# Patient Record
Sex: Female | Born: 1945 | Race: White | Hispanic: No | Smoking: Never smoker
Health system: Southern US, Community
[De-identification: ages and names within clinical notes are randomized; demographics above are authoritative.]

## PROBLEM LIST (undated history)

## (undated) DIAGNOSIS — I493 Ventricular premature depolarization: Secondary | ICD-10-CM

## (undated) DIAGNOSIS — B2790 Infectious mononucleosis, unspecified without complication: Secondary | ICD-10-CM

## (undated) DIAGNOSIS — E785 Hyperlipidemia, unspecified: Secondary | ICD-10-CM

## (undated) DIAGNOSIS — I341 Nonrheumatic mitral (valve) prolapse: Secondary | ICD-10-CM

## (undated) DIAGNOSIS — Z0000 Encounter for general adult medical examination without abnormal findings: Secondary | ICD-10-CM

## (undated) DIAGNOSIS — Z8669 Personal history of other diseases of the nervous system and sense organs: Secondary | ICD-10-CM

## (undated) DIAGNOSIS — IMO0002 Reserved for concepts with insufficient information to code with codable children: Secondary | ICD-10-CM

## (undated) DIAGNOSIS — K219 Gastro-esophageal reflux disease without esophagitis: Secondary | ICD-10-CM

## (undated) DIAGNOSIS — N6019 Diffuse cystic mastopathy of unspecified breast: Secondary | ICD-10-CM

## (undated) DIAGNOSIS — K589 Irritable bowel syndrome without diarrhea: Secondary | ICD-10-CM

## (undated) DIAGNOSIS — Z9889 Other specified postprocedural states: Secondary | ICD-10-CM

## (undated) HISTORY — PX: BREAST CYST ASPIRATION: SHX578

## (undated) HISTORY — DX: Ventricular premature depolarization: I49.3

## (undated) HISTORY — PX: BIOPSY THYROID: PRO38

## (undated) HISTORY — DX: Hyperlipidemia, unspecified: E78.5

## (undated) HISTORY — DX: Personal history of other diseases of the nervous system and sense organs: Z86.69

## (undated) HISTORY — DX: Nonrheumatic mitral (valve) prolapse: I34.1

## (undated) HISTORY — DX: Diffuse cystic mastopathy of unspecified breast: N60.19

## (undated) HISTORY — DX: Infectious mononucleosis, unspecified without complication: B27.90

## (undated) HISTORY — DX: Irritable bowel syndrome, unspecified: K58.9

## (undated) HISTORY — DX: Other specified postprocedural states: Z98.890

## (undated) HISTORY — PX: LAPAROSCOPIC TUBAL LIGATION: SHX1937

## (undated) HISTORY — DX: Reserved for concepts with insufficient information to code with codable children: IMO0002

## (undated) HISTORY — DX: Encounter for general adult medical examination without abnormal findings: Z00.00

---

## 1990-09-10 HISTORY — PX: VAGINAL HYSTERECTOMY: SUR661

## 1996-12-31 ENCOUNTER — Encounter: Payer: Self-pay | Admitting: Internal Medicine

## 1999-09-13 ENCOUNTER — Other Ambulatory Visit: Admission: RE | Admit: 1999-09-13 | Discharge: 1999-09-13 | Payer: Self-pay | Admitting: Obstetrics and Gynecology

## 1999-10-12 ENCOUNTER — Encounter: Payer: Self-pay | Admitting: Internal Medicine

## 1999-10-12 LAB — CONVERTED CEMR LAB

## 2000-11-05 ENCOUNTER — Other Ambulatory Visit: Admission: RE | Admit: 2000-11-05 | Discharge: 2000-11-05 | Payer: Self-pay | Admitting: Obstetrics and Gynecology

## 2001-11-25 ENCOUNTER — Other Ambulatory Visit: Admission: RE | Admit: 2001-11-25 | Discharge: 2001-11-25 | Payer: Self-pay | Admitting: Gynecology

## 2003-06-28 ENCOUNTER — Other Ambulatory Visit: Admission: RE | Admit: 2003-06-28 | Discharge: 2003-06-28 | Payer: Self-pay | Admitting: Gynecology

## 2003-06-30 ENCOUNTER — Encounter: Payer: Self-pay | Admitting: Gynecology

## 2003-06-30 ENCOUNTER — Encounter: Admission: RE | Admit: 2003-06-30 | Discharge: 2003-06-30 | Payer: Self-pay | Admitting: Gynecology

## 2004-10-20 ENCOUNTER — Encounter: Admission: RE | Admit: 2004-10-20 | Discharge: 2004-10-20 | Payer: Self-pay | Admitting: Gynecology

## 2004-10-24 ENCOUNTER — Other Ambulatory Visit: Admission: RE | Admit: 2004-10-24 | Discharge: 2004-10-24 | Payer: Self-pay | Admitting: Gynecology

## 2004-10-24 ENCOUNTER — Encounter: Payer: Self-pay | Admitting: Internal Medicine

## 2004-10-24 LAB — CONVERTED CEMR LAB

## 2005-01-05 ENCOUNTER — Ambulatory Visit: Payer: Self-pay | Admitting: Internal Medicine

## 2005-01-31 ENCOUNTER — Ambulatory Visit: Payer: Self-pay | Admitting: Internal Medicine

## 2005-03-06 ENCOUNTER — Other Ambulatory Visit: Admission: RE | Admit: 2005-03-06 | Discharge: 2005-03-06 | Payer: Self-pay | Admitting: Gynecology

## 2005-10-31 ENCOUNTER — Other Ambulatory Visit: Admission: RE | Admit: 2005-10-31 | Discharge: 2005-10-31 | Payer: Self-pay | Admitting: Gynecology

## 2005-11-02 ENCOUNTER — Encounter: Admission: RE | Admit: 2005-11-02 | Discharge: 2005-11-02 | Payer: Self-pay | Admitting: Gynecology

## 2005-11-09 ENCOUNTER — Ambulatory Visit: Payer: Self-pay | Admitting: Internal Medicine

## 2005-11-16 ENCOUNTER — Ambulatory Visit: Payer: Self-pay | Admitting: Internal Medicine

## 2005-11-26 ENCOUNTER — Ambulatory Visit (HOSPITAL_COMMUNITY): Admission: RE | Admit: 2005-11-26 | Discharge: 2005-11-26 | Payer: Self-pay | Admitting: Internal Medicine

## 2005-11-29 ENCOUNTER — Ambulatory Visit: Payer: Self-pay

## 2005-11-29 ENCOUNTER — Encounter: Payer: Self-pay | Admitting: Cardiology

## 2005-12-07 ENCOUNTER — Encounter (INDEPENDENT_AMBULATORY_CARE_PROVIDER_SITE_OTHER): Payer: Self-pay | Admitting: Specialist

## 2005-12-07 ENCOUNTER — Ambulatory Visit (HOSPITAL_COMMUNITY): Admission: RE | Admit: 2005-12-07 | Discharge: 2005-12-07 | Payer: Self-pay | Admitting: Internal Medicine

## 2005-12-21 ENCOUNTER — Ambulatory Visit: Payer: Self-pay | Admitting: Internal Medicine

## 2006-09-17 ENCOUNTER — Other Ambulatory Visit: Admission: RE | Admit: 2006-09-17 | Discharge: 2006-09-17 | Payer: Self-pay | Admitting: Gynecology

## 2006-09-17 ENCOUNTER — Encounter: Payer: Self-pay | Admitting: Internal Medicine

## 2007-04-29 ENCOUNTER — Encounter: Admission: RE | Admit: 2007-04-29 | Discharge: 2007-04-29 | Payer: Self-pay | Admitting: Internal Medicine

## 2007-06-26 ENCOUNTER — Encounter: Payer: Self-pay | Admitting: Internal Medicine

## 2007-06-26 DIAGNOSIS — B2790 Infectious mononucleosis, unspecified without complication: Secondary | ICD-10-CM | POA: Insufficient documentation

## 2007-06-26 DIAGNOSIS — K589 Irritable bowel syndrome without diarrhea: Secondary | ICD-10-CM | POA: Insufficient documentation

## 2007-06-26 DIAGNOSIS — H8110 Benign paroxysmal vertigo, unspecified ear: Secondary | ICD-10-CM | POA: Insufficient documentation

## 2007-06-26 DIAGNOSIS — I4949 Other premature depolarization: Secondary | ICD-10-CM | POA: Insufficient documentation

## 2007-06-26 DIAGNOSIS — I059 Rheumatic mitral valve disease, unspecified: Secondary | ICD-10-CM | POA: Insufficient documentation

## 2007-06-26 DIAGNOSIS — N6019 Diffuse cystic mastopathy of unspecified breast: Secondary | ICD-10-CM | POA: Insufficient documentation

## 2007-06-26 DIAGNOSIS — E785 Hyperlipidemia, unspecified: Secondary | ICD-10-CM | POA: Insufficient documentation

## 2007-06-26 DIAGNOSIS — Z87898 Personal history of other specified conditions: Secondary | ICD-10-CM

## 2007-07-10 ENCOUNTER — Ambulatory Visit: Payer: Self-pay | Admitting: Internal Medicine

## 2007-07-10 ENCOUNTER — Encounter: Payer: Self-pay | Admitting: Internal Medicine

## 2007-07-10 DIAGNOSIS — R1310 Dysphagia, unspecified: Secondary | ICD-10-CM

## 2007-09-24 ENCOUNTER — Ambulatory Visit: Payer: Self-pay | Admitting: Internal Medicine

## 2007-09-24 LAB — CONVERTED CEMR LAB
ALT: 16 units/L (ref 0–35)
Triglycerides: 185 mg/dL — ABNORMAL HIGH (ref 0–149)
VLDL: 37 mg/dL (ref 0–40)

## 2007-09-25 ENCOUNTER — Encounter: Payer: Self-pay | Admitting: Internal Medicine

## 2007-12-08 ENCOUNTER — Ambulatory Visit: Payer: Self-pay | Admitting: Gastroenterology

## 2007-12-16 ENCOUNTER — Ambulatory Visit: Payer: Self-pay | Admitting: Internal Medicine

## 2007-12-26 ENCOUNTER — Encounter: Payer: Self-pay | Admitting: Internal Medicine

## 2007-12-26 ENCOUNTER — Ambulatory Visit: Payer: Self-pay | Admitting: Gastroenterology

## 2007-12-26 LAB — HM COLONOSCOPY

## 2008-06-10 ENCOUNTER — Encounter: Admission: RE | Admit: 2008-06-10 | Discharge: 2008-06-10 | Payer: Self-pay | Admitting: Internal Medicine

## 2008-07-02 ENCOUNTER — Encounter: Payer: Self-pay | Admitting: Internal Medicine

## 2008-07-02 ENCOUNTER — Encounter: Admission: RE | Admit: 2008-07-02 | Discharge: 2008-07-02 | Payer: Self-pay | Admitting: Internal Medicine

## 2008-07-07 ENCOUNTER — Encounter: Payer: Self-pay | Admitting: Internal Medicine

## 2008-10-28 ENCOUNTER — Ambulatory Visit: Payer: Self-pay | Admitting: Internal Medicine

## 2008-10-28 LAB — CONVERTED CEMR LAB
ALT: 19 units/L (ref 0–35)
AST: 19 units/L (ref 0–37)
Albumin: 4 g/dL (ref 3.5–5.2)
BUN: 19 mg/dL (ref 6–23)
Bilirubin Urine: NEGATIVE
Chloride: 103 meq/L (ref 96–112)
Cholesterol: 186 mg/dL (ref 0–200)
Eosinophils Relative: 3.7 % (ref 0.0–5.0)
GFR calc Af Amer: 109 mL/min
GFR calc non Af Amer: 90 mL/min
Glucose, Bld: 92 mg/dL (ref 70–99)
Ketones, ur: NEGATIVE mg/dL
Leukocytes, UA: NEGATIVE
Lymphocytes Relative: 29.7 % (ref 12.0–46.0)
MCHC: 33.7 g/dL (ref 30.0–36.0)
Monocytes Absolute: 0.3 10*3/uL (ref 0.1–1.0)
Monocytes Relative: 6.1 % (ref 3.0–12.0)
Neutro Abs: 3 10*3/uL (ref 1.4–7.7)
Nitrite: NEGATIVE
Potassium: 4.4 meq/L (ref 3.5–5.1)
RBC: 5.03 M/uL (ref 3.87–5.11)
RDW: 12.1 % (ref 11.5–14.6)
TSH: 1.98 microintl units/mL (ref 0.35–5.50)
Urobilinogen, UA: 0.2 (ref 0.0–1.0)
VLDL: 29 mg/dL (ref 0–40)

## 2008-11-04 ENCOUNTER — Ambulatory Visit: Payer: Self-pay | Admitting: Internal Medicine

## 2008-12-16 ENCOUNTER — Telehealth: Payer: Self-pay | Admitting: Internal Medicine

## 2009-06-17 ENCOUNTER — Telehealth: Payer: Self-pay | Admitting: Internal Medicine

## 2009-11-14 ENCOUNTER — Telehealth: Payer: Self-pay | Admitting: Internal Medicine

## 2009-11-15 ENCOUNTER — Encounter: Admission: RE | Admit: 2009-11-15 | Discharge: 2009-11-15 | Payer: Self-pay | Admitting: Gynecology

## 2009-11-23 ENCOUNTER — Encounter: Payer: Self-pay | Admitting: Internal Medicine

## 2009-12-19 ENCOUNTER — Telehealth: Payer: Self-pay | Admitting: Internal Medicine

## 2009-12-20 ENCOUNTER — Encounter: Payer: Self-pay | Admitting: Internal Medicine

## 2010-01-02 ENCOUNTER — Ambulatory Visit: Payer: Self-pay | Admitting: Internal Medicine

## 2010-01-05 ENCOUNTER — Encounter (INDEPENDENT_AMBULATORY_CARE_PROVIDER_SITE_OTHER): Payer: Self-pay | Admitting: *Deleted

## 2010-01-18 ENCOUNTER — Ambulatory Visit: Payer: Self-pay | Admitting: Cardiovascular Disease

## 2010-01-18 DIAGNOSIS — R011 Cardiac murmur, unspecified: Secondary | ICD-10-CM | POA: Insufficient documentation

## 2010-01-18 DIAGNOSIS — R9431 Abnormal electrocardiogram [ECG] [EKG]: Secondary | ICD-10-CM

## 2010-01-18 DIAGNOSIS — I359 Nonrheumatic aortic valve disorder, unspecified: Secondary | ICD-10-CM | POA: Insufficient documentation

## 2010-01-23 ENCOUNTER — Telehealth: Payer: Self-pay | Admitting: Internal Medicine

## 2010-02-13 ENCOUNTER — Encounter: Payer: Self-pay | Admitting: Cardiovascular Disease

## 2010-02-13 ENCOUNTER — Ambulatory Visit (HOSPITAL_COMMUNITY): Admission: RE | Admit: 2010-02-13 | Discharge: 2010-02-13 | Payer: Self-pay | Admitting: Cardiovascular Disease

## 2010-02-13 ENCOUNTER — Ambulatory Visit: Payer: Self-pay | Admitting: Internal Medicine

## 2010-02-13 ENCOUNTER — Ambulatory Visit: Payer: Self-pay

## 2010-10-12 NOTE — Progress Notes (Signed)
Summary: LAB ORDER - Pt aware dr is out of the office.  Phone Note Call from Patient   Summary of Call: Patient is requesting written order for labs to be faxed to her work. ([facility] has office lab now) Fax attn Jianna to [phone] Initial call taken by: Lamar Sprinkles, CMA,  November 14, 2009 9:20 AM  Follow-up for Phone Call        OK: Lipid 272.4, Hepatic 995.20, CBC 995.20 Follow-up by: Jacques Navy MD,  November 22, 2009 12:38 PM  Additional Follow-up for Phone Call Additional follow up Details #1::        Order written, pending signature................Marland KitchenLamar Sprinkles, CMA  November 22, 2009 5:12 PM     Additional Follow-up for Phone Call Additional follow up Details #2::    done. Left message on voicemail notifying patient. Follow-up by: Lucious Groves,  November 23, 2009 11:04 AM

## 2010-10-12 NOTE — Letter (Signed)
Summary: Primary Care Consult Scheduled Letter  [facility]  [address] [place], [place] [address]   Phone: [phone]  Fax: [phone]      01/05/2010 MRN: [id number]  [facility] [address] [place], [place]  [address]    Dear Ms. Purdum,      We have scheduled an appointment for you.  At the recommendation of Dr.Norins we have scheduled you a consult with  Dr Eden Emms [facility]) on May 11,2011 at 11:45AM. Their address is [address] [employer] [address] . The office phone number is [phone]. If this appointment day and time is not convenient for you, please feel free to call the office of the doctor you are being referred to at the number listed above and reschedule the appointment.     It is important for you to keep your scheduled appointments. We are here to make sure you are given good patient care. If you have questions or you have made changes to your appointment, please notify us at  [phone]        , ask for   Debra           .    Patient Care Coordinator [facility]

## 2010-10-12 NOTE — Assessment & Plan Note (Signed)
Summary: np3/MVP/jml      Allergies Added:   Primary Provider:  Norins  CC:  dizziness pt has been diagnosed with vertigo.  History of Present Illness: [place] is seen today for F/U of ? MVP, mjurmur, abnormal ECG and elevated lipids.  She has an echo in 48 whitch showed no prolapse but mild MR.  She has been observing SBE probphylaxis.  She has an ECG with nonspecific ST/ Twave changes  She has no palpitaitons, SSCP, dhyspnea or syncope.  She has had life long vertigo whicih is debilitating at times.  She has been on statin for elevated lipids but has no other CRF;s.  She does take an aspirin daily.  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     Current Problems (verified): 1)  Aortic Stenosis  (ICD-424.1) 2)  Mitral Valve Prolapse  (ICD-424.0) 3)  Hyperlipidemia  (ICD-272.4) 4)  Routine General Medical Exam@health  Care Facl  (ICD-V70.0) 5)  Dysphagia, Unspecified  (ICD-787.20) 6)  Cystopexy  () 7)  Positional Vertigo  (ICD-386.11) 8)  Mononucleosis  (ICD-075) 9)  Fibrocystic Breast Disease  (ICD-610.1) 10)  Premature Ventricular Contractions  (ICD-427.69) 11)  Irritable Bowel Syndrome  (ICD-564.1) 12)  Hx of Migraines, Hx of  (ICD-V13.8)  Current Medications (verified): 1)  Vivelle-Dot 0.05 Mg/24hr Pttw (Estradiol) .... Twice Weekly 2)  Centrum  Liqd (Multiple Vitamins-Minerals) .[name].. 1 Tsp Daily 3)  Flax Seed Oil 1000 Mg  Caps (Flaxseed (Linseed)) .... By Mouth Once Daily 4)  Vytorin 10-20 Mg  Tabs (Ezetimibe-Simvastatin) .... Q Pm 5)  Vagifem 25 Mcg  Tabs (Estradiol) .[name].. 1 Tab Per [place]aginia Twice /wk 6)  Zantac 150 Mg  Tabs (Ranitidine Hcl) .[name].. 1-2 Qd 7)  Metamucil 30.9 %  Powd (Psyllium) .... Once Daily 8)  Aspirin 81 Mg Chew (Aspirin) .... Take 1 Tablet By Mouth Once A Day 9)  Vitamin D 2000 Unit Caps (Cholecalciferol) .[name].. 1 Tab Daily  Allergies (verified): 1)  ! Pcn  Past History:  Past Medical History: Last updated:  01/13/2010 Current Problems:  MITRAL VALVE PROLAPSE (ICD-424.0) HYPERLIPIDEMIA (ICD-272.4) ROUTINE GENERAL MEDICAL EXAM@HEALTH  CARE FACL (ICD-V70.0) DYSPHAGIA, UNSPECIFIED (ICD-787.20) * CYSTOPEXY POSITIONAL VERTIGO (ICD-386.11) MONONUCLEOSIS (ICD-075) FIBROCYSTIC BREAST DISEASE (ICD-610.1) PREMATURE VENTRICULAR CONTRACTIONS (ICD-427.69) IRRITABLE BOWEL SYNDROME (ICD-564.1) Hx of MIGRAINES, HX OF (ICD-V13.8)  Past Surgical History: Last updated: 07/10/2007 Hysterectomy (Vaginal 1992) Tubal ligation (Laparoscopic bilateral) * CYSTOPEXY Thyroid biopsy  G3P3 NSVD  Family History: Last updated: 2008-11-24 father-died at 72, hemochromatosis,HTN, Lipids, CAD/CABG mother-living at '24, HTN, Lipid, PFO closure, CVA, hiatal hernia repairs, OA, PTVDP-ss syndrome (Dr. 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   Vital Signs:  Patient profile:   65 year old female Height:      67 inches Weight:      170 pounds BMI:     26.72 Pulse rate:   67 / minute Resp:     14 per minute BP sitting:   134 / 62  (left arm)  Vitals Entered By: Kem Parkinson (Jan 18, 2010 11:40  AM)  Physical Exam  General:  Affect appropriate Healthy:  appears stated age HEENT: normal Neck supple with no adenopathy JVP normal no bruits no thyromegaly Lungs clear with no wheezing and good diaphragmatic motion Heart:  S1/S2 AR murmur, no rub, gallop or click PMI normal Abdomen: benighn, BS positve, no  tenderness, no AAA no bruit.  No HSM or HJR Distal pulses intact with no bruits No edema Neuro non-focal Skin warm and dry    Impression & Recommendations:  Problem # 1:  MURMUR (ICD-785.2) Mild MR in 1998.  Murmur on exam most prominant is AR.  F/U echo.  No need for SBE prophylaxis  Problem # 2:  HYPERLIPIDEMIA (ICD-272.4) At target with no side effects Her updated medication list for this problem includes:    Vytorin 10-20 Mg Tabs (Ezetimibe-simvastatin) ..... Q pm  CHOL: 186 (10/28/2008)   LDL: 101 (10/28/2008)   HDL: 55.6 (10/28/2008)   TG: 146 (10/28/2008)  Problem # 3:  ELECTROCARDIOGRAM, ABNORMAL (ICD-794.31) Nonspecific ST/T wave changes.  No ischemic symptoms.  Yearly ECG  Echo being checked but no need for ETT Her updated medication list for this problem includes:    Aspirin 81 Mg Chew (Aspirin) .[name]... Take 1 tablet by mouth once a day  Other Orders: Echocardiogram (Echo)  Patient Instructions: 1)  Your physician recommends that you schedule a follow-up appointment in: AS NEEDED WITH DR. Eden Emms 2)  Your physician recommends that you continue on your current medications as directed. Please refer to the Current Medication list given to you today. 3)  Your physician has requested that you have an echocardiogram.  Echocardiography is a painless test that uses sound waves to create images of your heart. It provides your doctor with information about the size and shape of your heart and how well your heart's chambers and valves are working.  This procedure takes approximately one hour. There are no restrictions for this procedure.   EKG Report  Procedure date:  01/18/2010  Findings:      NSR 67 R axis Low voltage T wave abnormality consider inferior ischemia Abnormal ECG

## 2010-10-12 NOTE — Progress Notes (Signed)
  Phone Note Refill Request Message from:  Fax from Pharmacy on December 19, 2009 11:04 AM  Refills Requested: Medication #1:  VYTORIN 10-20 MG  TABS q PM Initial call taken by: Ami Bullins CMA,  December 19, 2009 11:04 AM    Prescriptions: VYTORIN 10-20 MG  TABS (EZETIMIBE-SIMVASTATIN) q PM  #30 x 5   Entered by:   Ami Bullins CMA   Authorized by:   Jacques Navy MD   Signed by:   Bill Salinas CMA on 12/19/2009   Method used:   [facility]  [address] [facility] [address] [id number]       [address]       [place], [place]  [address]       Ph: [phone]       Fax: [phone]   RxID:   [id number]

## 2010-10-12 NOTE — Assessment & Plan Note (Signed)
Summary: PHYSICAL--PT WILL HAVE LABS AT HER JOB/PATHOLOGY OFFICE STC   Vital Signs:  Patient profile:   65 year old female Height:      67 inches Weight:      169 pounds BMI:     26.56 O2 Sat:      97 % on Room air Temp:     97.1 degrees F oral Pulse rate:   60 / minute BP sitting:   122 / 70  (left arm) Cuff size:   regular  Vitals Entered By: Bill Salinas CMA (January 02, 2010 1:32 PM)  O2 Flow:  Room air CC: Pt here for cpx, pt just had her pap done this morning with Pamelia Hoit with Dr Johnn Hai office, her last eye exam was in aug 2010/ ab  Vision Screening:      Vision Comments: Pt had eye exam done in aug 2010 with Dr Nelle Don with repeat exam in 1 year.  Vision Entered By: Bill Salinas CMA (January 02, 2010 1:36 PM)     Last PAP Result normal   Primary Care Provider:  Julieta Rogalski  CC:  Pt here for cpx, pt just had her pap done this morning with Pamelia Hoit with Dr Johnn Hai office, and her last eye exam was in aug 2010/ ab.  History of Present Illness: Patient presents for routine medical follow-up. She had her Gyn exam this AM, including breast exam. She had a normal mammogram in march '11. Patient had colonoscopy in '09;. She reports that her atrophic vaginitis has responded to Vagifem. Feeling well and doing well. She has been taking flax seed oil and metamucil and vitamins every morning. She is getting a little exercise.   Patient keeps complete records: last EKG done here in '07- normal. Last 2D echo done '98 wtht mild mitral regurgitation.   Current Medications (verified): 1)  Vivelle-Dot 0.05 Mg/24hr Pttw (Estradiol) .... Twice Weekly 2)  Centrum  Liqd (Multiple Vitamins-Minerals) .[name].. 1 Tsp Daily 3)  Flax Seed Oil 1000 Mg  Caps (Flaxseed (Linseed)) .... By Mouth Once Daily 4)  Vytorin 10-20 Mg  Tabs (Ezetimibe-Simvastatin) .... Q Pm 5)  Vagifem 25 Mcg  Tabs (Estradiol) .[name].. 1 Tab Per [place] Twice /wk 6)  Zantac 150 Mg  Tabs (Ranitidine Hcl) .[name].. 1-2 Qd 7)   Metamucil 30.9 %  Powd (Psyllium) .... Once Daily 8)  Aspirin 81 Mg Chew (Aspirin) .... Take 1 Tablet By Mouth Once A Day 9)  Vitamin D 2000 Unit Caps (Cholecalciferol) .[name].. 1 Tab Daily  Allergies (verified): 1)  ! Pcn  Past History:  Past Medical History: Last updated: 06/26/2007 MITRAL VALVE PROLAPSE (ICD-424.0) POSITIONAL VERTIGO (ICD-386.11) MONONUCLEOSIS (ICD-075) FIBROCYSTIC BREAST DISEASE (ICD-610.1) PREMATURE VENTRICULAR CONTRACTIONS (ICD-427.69) IRRITABLE BOWEL SYNDROME (ICD-564.1) Hx of MIGRAINES, HX OF (ICD-V13.8) Hyperlipidemia  Past Surgical History: Last updated: 07/10/2007 Hysterectomy (Vaginal 1992) Tubal ligation (Laparoscopic bilateral) * CYSTOPEXY Thyroid biopsy  G3P3 NSVD  Family History: Last updated: 11-10-2008 father-died at 72, hemochromatosis,HTN, Lipids, CAD/CABG mother-living at '24, HTN, Lipid, PFO closure, CVA, hiatal hernia repairs, OA, PTVDP-ss syndrome (Dr. 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    Physical Exam  General:  WNWD heavyset white female in no distress Head:  Normocephalic and atraumatic without obvious abnormalities. No apparent alopecia or balding. Eyes:  No corneal or  conjunctival inflammation noted. EOMI. Perrla. Funduscopic exam benign, without hemorrhages, exudates or papilledema. Vision grossly normal. Ears:  External ear exam shows no significant lesions or deformities.  Otoscopic examination reveals clear canals, tympanic membranes are intact bilaterally without bulging, retraction, inflammation or discharge. Hearing is grossly normal bilaterally. Nose:  no external deformity and no external erythema.   Mouth:  Oral mucosa and oropharynx without lesions or exudates.  Teeth in good repair. Neck:  supple, full ROM, no thyromegaly, and no carotid bruits.   Chest Wall:  No deformities, masses, or tenderness noted. Breasts:  deferred to gyn Lungs:  Normal respiratory effort, chest expands symmetrically. Lungs are clear to auscultation, no crackles or wheezes. Heart:  normal rate, regular rhythm, no rub, no JVD, and no HJR.  I-II/VI very soft diastolic murmur best at the apex.  Abdomen:  Bowel sounds positive,abdomen soft and non-tender without masses, organomegaly or hernias noted. Genitalia:  deferred to gyn Msk:  normal ROM, no joint tenderness, no joint swelling, no joint warmth, no joint deformities, and no joint instability.   Pulses:  2+ radial and DP puleses Extremities:  No clubbing, cyanosis, edema, or deformity noted with normal full range of motion of all joints.   Neurologic:  No cranial nerve deficits noted. Station and gait are normal. Plantar reflexes are down-going bilaterally. DTRs are symmetrical throughout. Sensory, motor and coordinative functions appear intact. Skin:  turgor normal, color normal, no suspicious lesions, and no ulcerations.   Cervical Nodes:  no anterior cervical adenopathy and no posterior cervical adenopathy.   Psych:  Oriented X3, memory intact for recent and remote, normally interactive, good eye contact, and not anxious appearing.     Impression & Recommendations:  Problem # 1:  HYPERLIPIDEMIA (ICD-272.4)  Her  updated medication list for this problem includes:    Vytorin 10-20 Mg Tabs (Ezetimibe-simvastatin) ..... Q pm  Outside labs revealed excellent control of liids on her present regimen.  Plan - continue Vytorin at present dose  Problem # 2:  MITRAL VALVE PROLAPSE (ICD-424.0) Last 2D echo in '98. Murmur is very feint.  Plan - routine follow-up 2D echo to assess any progressive valvular disease  Her updated medication list for this problem includes:    Aspirin 81 Mg Chew (Aspirin) .[name]... Take 1 tablet by mouth once a day  Orders: Cardiology Referral (Cardiology)  Problem # 3:  IRRITABLE BOWEL SYNDROME (ICD-564.1) Doing well with stable symptoms  Problem # 4:  Preventive Health Care (ICD-V70.0) Current with Gyn, breast health, colonoscopy. Her lab results are within normal limits. General exam today is unremarkable except for mild cardiac murmur. She is workng on a heart healthy diet and plans to increase her exercises.  In summary - a very nice woman who seems mediclly stable at this time. She will be provided a copy of her 2D echo when done. She will return in 1 year or as needed.  Complete Medication List: 1)  Vivelle-dot 0.05 Mg/24hr Pttw (Estradiol) .... Twice weekly 2)  Centrum Liqd (Multiple vitamins-minerals) .[name].. 1 tsp daily 3)  Flax Seed Oil 1000 Mg Caps (Flaxseed (linseed)) .... By  mouth once daily 4)  Vytorin 10-20 Mg Tabs (Ezetimibe-simvastatin) .... Q pm 5)  Vagifem 25 Mcg Tabs (Estradiol) .[name].. 1 tab per vaginia twice /wk 6)  Zantac 150 Mg Tabs (Ranitidine hcl) .[name].. 1-2 qd 7)  Metamucil 30.9 % Powd (Psyllium) .... Once daily 8)  Aspirin 81 Mg Chew (Aspirin) .... Take 1 tablet by mouth once a day 9)  Vitamin D 2000 Unit Caps (Cholecalciferol) .[name].. 1 tab daily   Preventive Care Screening  Pap Smear:    Date:  01/02/2010    Results:  normal  Mammogram:    Date:  11/15/2009    Results:  normal     Immunization History:  Influenza Immunization History:     Influenza:  historical (08/10/2009)    Preventive Care Screening  Pap Smear:    Date:  01/02/2010    Results:  normal  Mammogram:    Date:  11/15/2009    Results:  normal

## 2010-10-12 NOTE — Progress Notes (Signed)
Summary: vytorin  Phone Note Refill Request Message from:  Fax from Pharmacy on Jan 23, 2010 9:25 AM  Refills Requested: Medication #1:  VYTORIN 10-20 MG  TABS q PM   Last Refilled: 11/16/2009  Method Requested: Electronic Initial call taken by: Orlan Leavens,  Jan 23, 2010 9:26 AM    Prescriptions: VYTORIN 10-20 MG  TABS (EZETIMIBE-SIMVASTATIN) q PM  #30 x 5   Entered by:   Orlan Leavens   Authorized by:   Jacques Navy MD   Signed by:   Orlan Leavens on 01/23/2010   Method used:   [facility]  [address] [phone]       [address]       [place], [place]  [address]       Ph: [phone]       Fax: [phone]   RxID:   [id number]

## 2010-11-10 ENCOUNTER — Other Ambulatory Visit: Payer: Self-pay | Admitting: Gynecology

## 2010-11-10 ENCOUNTER — Telehealth: Payer: Self-pay | Admitting: Internal Medicine

## 2010-11-10 ENCOUNTER — Encounter: Payer: Self-pay | Admitting: Internal Medicine

## 2010-11-10 DIAGNOSIS — Z1231 Encounter for screening mammogram for malignant neoplasm of breast: Secondary | ICD-10-CM

## 2010-11-21 NOTE — Progress Notes (Signed)
Summary: LAB ORDER   Phone Note Call from Patient Call back at [facility] Phone [phone]   Summary of Call: Pt is scheduled for apt 12/20/10. She is req written lab order to be faxed to [place] [facility] (where she works). OK?  Initial call taken by: Lamar Sprinkles, CMA,  November 10, 2010 12:45 PM  Follow-up for Phone Call        ok forTSH,  lipid 272.4,; Hepatic 995.20; CBC424.1` Follow-up by: Jacques Navy MD,  November 10, 2010 6:05 PM  Additional Follow-up for Phone Call Additional follow up Details #1::        Lab order to be faxed to [phone], Pt informed  Additional Follow-up by: Lamar Sprinkles, CMA,  November 13, 2010 11:43 AM

## 2010-11-21 NOTE — Progress Notes (Signed)
Summary: Hand Written Lab Orders / [facility]  Lab Orders / [place]   Imported By: Lennie Odor 11/15/2010 16:49:20  _____________________________________________________________________  External Attachment:    Type:   Image     Comment:   External Document

## 2010-12-18 ENCOUNTER — Ambulatory Visit
Admission: RE | Admit: 2010-12-18 | Discharge: 2010-12-18 | Disposition: A | Discharge status: Home / Self Care | Payer: BLUE CROSS/BLUE SHIELD | Source: Ambulatory Visit | Attending: Gynecology | Admitting: Gynecology

## 2010-12-18 ENCOUNTER — Other Ambulatory Visit: Payer: Self-pay | Admitting: Gynecology

## 2010-12-18 DIAGNOSIS — Z1231 Encounter for screening mammogram for malignant neoplasm of breast: Secondary | ICD-10-CM

## 2010-12-19 ENCOUNTER — Encounter: Payer: Self-pay | Admitting: Internal Medicine

## 2010-12-19 ENCOUNTER — Other Ambulatory Visit: Payer: Self-pay | Admitting: Internal Medicine

## 2010-12-20 ENCOUNTER — Ambulatory Visit (INDEPENDENT_AMBULATORY_CARE_PROVIDER_SITE_OTHER): Payer: BLUE CROSS/BLUE SHIELD | Admitting: Internal Medicine

## 2010-12-20 VITALS — BP 120/82 | HR 73 | Temp 98.3°F | Wt 170.0 lb

## 2010-12-20 DIAGNOSIS — Z23 Encounter for immunization: Secondary | ICD-10-CM

## 2010-12-20 DIAGNOSIS — Z0000 Encounter for general adult medical examination without abnormal findings: Secondary | ICD-10-CM

## 2010-12-20 MED ORDER — EZETIMIBE-SIMVASTATIN 10-20 MG PO TABS: 1.0000 | ORAL_TABLET | Freq: Every day | ORAL | Status: DC

## 2010-12-20 NOTE — Progress Notes (Signed)
Subjective:    Patient ID: Olivia Mendoza, female    DOB: 1946-04-08, 65 y.o.   MRN: [id number]  HPI The patient is here for annual Medicare wellness examination and management of other chronic and acute problems.   The risk factors are reflected in the social history.  The roster of all physicians providing medical care to patient - is listed in the Snapshot section of the chart.  Activities of daily living:  The patient is 100% inedpendent in all ADLs: dressing, toileting, feeding as well as independent mobility  Home safety : The patient has smoke detectors in the home. They wear seatbelts .No firearms at homes. There is no violence in the home.   There is no risks for hepatitis, STDs or HIV. She does have HPN by cervical testing. There is no history of blood transfusion. They have no travel history to infectious disease endemic areas of the world.  The patient has seen their dentist in the last six month. They have seen their eye doctor in the last year. They deny any hearing difficulty and have not had audiologic testing in the last year.  They do not  have excessive sun exposure. Discussed the need for sun protection: hats, long sleeves and use of sunscreen if there is significant sun exposure.   Diet: the importance of a healthy diet is discussed. They do have a healthy  diet.  The patient has a regular exercise program: trampoline stepping ,  30 min duration, 3 per week.  The benefits of regular aerobic exercise were discussed.  Depression screen: there are no signs or vegative symptoms of depression- irritability, change in appetite, anhedonia, sadness/tearfullness.  Cognitive assessment: the patient manages all their financial and personal affairs and is actively engaged. They could relate day,date,year and events; recalled 3/3 objects at 3 minutes; performed clock-face test normally.  The following portions of the patient's history were reviewed and updated as appropriate:  allergies, current medications, past family history, past medical history,  past surgical history, past social history  and problem list.  Vision, hearing, body mass index were assessed and reviewed.   During the course of the visit the patient was educated and counseled about appropriate screening and preventive services including : fall prevention , diabetes screening, nutrition counseling, colorectal cancer screening, and recommended immunizations.  Past Medical History  Diagnosis Date  . MVP (mitral valve prolapse)   . Hyperlipemia   . Routine general medical examination at a health care facility   . Dysphagia   . History of cystopexy   . Positional vertigo   . Mononucleosis   . Fibrocystic breast disease   . PVC (premature ventricular contraction)   . IBS (irritable bowel syndrome)   . History of migraines    Past Surgical History  Procedure Date  . Vaginal hysterectomy 92  . Laparoscopic tubal ligation     Bilateral  . Biopsy thyroid    Family History  Problem Relation Age of Onset  . Hypertension Mother   . Hyperlipidemia Mother   . Stroke Mother   . Hiatal hernia Mother   . Arthritis Mother   . Heart disease Mother     PTVDP-ss syndrome (Dr Lynnea Ferrier)  . Hemochromatosis Father   . Hyperlipidemia Father   . Hypertension Father   . Heart disease Father     CAD/CABG  . Heart disease Brother     AAA Stent, PVD s/p stent, CAD, lipids   History   Social History  . Marital  Status: Divorced    Spouse Name: N/A    Number of Children: N/A  . Years of Education: N/A   Occupational History  . [employer]    Social History Main Topics  . Smoking status: Not on file  . Smokeless tobacco: Not on file  . Alcohol Use: Not on file  . Drug Use: Not on file  . Sexually Active: Not on file   Other Topics Concern  . Not on file   Social History Narrative   HSG, [employer] school.  Married 75yrs - divorced.  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 Work - continues to work for [employer].         Review of Systems  Constitutional: Negative.  Negative for fever, chills, activity change, appetite change, fatigue and unexpected weight change.  HENT: Negative.  Negative for congestion, rhinorrhea, sinus pressure and tinnitus.   Eyes: Negative.   Respiratory: Negative.   Cardiovascular: Negative.   Gastrointestinal:       Chronic dysphagia of solids that is most likely esophageal spasm. There have been no episodes of obstruction requiring intervention.  Genitourinary:       Has had a change in micturition: slowed stream and decreased frequency. No pain or discomfort and she has seen Dr. Nicholas Lose about this.   Musculoskeletal:       OA changes and stiffness in the hands, some hip pain. No medications.   Skin: Positive for rash.  Neurological: Negative.   Hematological: Negative.   Psychiatric/Behavioral: Negative.        Objective:   Physical Exam  Constitutional: She appears well-developed and well-nourished. No distress.  HENT:  Head: Normocephalic and atraumatic.  Right Ear: Tympanic membrane, external ear and ear canal normal.  Left Ear: Tympanic membrane, external ear and ear canal normal.  Nose: Nose normal.  Mouth/Throat: Oropharynx is clear and moist.  Eyes: Conjunctivae and EOM are normal. Pupils are equal, round, and reactive to light. Left eye exhibits no discharge. No scleral icterus.  Neck: Normal range of motion. Neck supple. No JVD present. No thyromegaly present.  Cardiovascular: Normal rate, regular rhythm, normal heart sounds and intact distal pulses.  Exam reveals no gallop.   No murmur heard. Pulmonary/Chest: Effort normal and breath sounds normal. No respiratory distress.  Lymphadenopathy:    She has no cervical adenopathy.          Assessment & Plan:  1. Lipids - reviewed labs from outside source -good control on present  regimen  2. GI- stable with no active symptoms of IBS. Labs were normal  3. Health maintenance - interval history is negative and she feels good. Physical exam, deferring gyn/breast exam to Dr. Nicholas Lose, is normal. Reviewed outside labs and they are normal. She is current with gyn, mammography and colorectal cancer screening. Immunization for tetnus and pneumonia are brought up to date.   In summary - a very nice woman who appears to be medically stable and doing well.  She will return as needed or in one year.

## 2010-12-21 ENCOUNTER — Encounter: Payer: Self-pay | Admitting: Internal Medicine

## 2010-12-22 ENCOUNTER — Encounter: Payer: Self-pay | Admitting: Internal Medicine

## 2011-05-19 ENCOUNTER — Other Ambulatory Visit: Payer: Self-pay | Admitting: Internal Medicine

## 2011-10-16 ENCOUNTER — Other Ambulatory Visit: Payer: Self-pay | Admitting: Internal Medicine

## 2011-12-10 ENCOUNTER — Other Ambulatory Visit: Payer: Self-pay | Admitting: Internal Medicine

## 2011-12-10 DIAGNOSIS — Z1231 Encounter for screening mammogram for malignant neoplasm of breast: Secondary | ICD-10-CM

## 2011-12-31 ENCOUNTER — Ambulatory Visit
Admission: RE | Admit: 2011-12-31 | Discharge: 2011-12-31 | Disposition: A | Discharge status: Home / Self Care | Payer: BC Managed Care – PPO | Source: Ambulatory Visit | Attending: Internal Medicine | Admitting: Internal Medicine

## 2011-12-31 DIAGNOSIS — Z1231 Encounter for screening mammogram for malignant neoplasm of breast: Secondary | ICD-10-CM

## 2012-01-29 ENCOUNTER — Telehealth: Payer: Self-pay | Admitting: *Deleted

## 2012-01-29 MED ORDER — EZETIMIBE-SIMVASTATIN 10-20 MG PO TABS: 1.0000 | ORAL_TABLET | Freq: Every day | ORAL | Status: DC

## 2012-01-29 NOTE — Telephone Encounter (Signed)
Medication refill sent to [facility] pharmacy. Patient aware.

## 2012-01-29 NOTE — Telephone Encounter (Signed)
Patient called to request what lab  Test be ordered prior to her appt. on 04/17/2012 with you so she may get those done. Works at [employer] and can fax orders to her theire. Request refills on her meds till the appt.. I will call her to see what she needs refills on. Fax number is [phone].

## 2012-02-25 ENCOUNTER — Other Ambulatory Visit: Payer: Self-pay | Admitting: Gynecology

## 2012-03-15 ENCOUNTER — Other Ambulatory Visit: Payer: Self-pay | Admitting: Internal Medicine

## 2012-03-17 ENCOUNTER — Other Ambulatory Visit: Payer: Self-pay | Admitting: General Practice

## 2012-03-17 MED ORDER — EZETIMIBE-SIMVASTATIN 10-20 MG PO TABS: 1.0000 | ORAL_TABLET | Freq: Every day | ORAL | Status: DC

## 2012-04-22 ENCOUNTER — Ambulatory Visit (INDEPENDENT_AMBULATORY_CARE_PROVIDER_SITE_OTHER): Payer: BC Managed Care – PPO | Admitting: Internal Medicine

## 2012-04-22 ENCOUNTER — Encounter: Payer: Self-pay | Admitting: Internal Medicine

## 2012-04-22 ENCOUNTER — Other Ambulatory Visit (INDEPENDENT_AMBULATORY_CARE_PROVIDER_SITE_OTHER): Payer: BC Managed Care – PPO

## 2012-04-22 VITALS — BP 140/68 | HR 72 | Temp 98.3°F | Resp 16 | Wt 166.0 lb

## 2012-04-22 DIAGNOSIS — K589 Irritable bowel syndrome without diarrhea: Secondary | ICD-10-CM

## 2012-04-22 DIAGNOSIS — Z0000 Encounter for general adult medical examination without abnormal findings: Secondary | ICD-10-CM

## 2012-04-22 DIAGNOSIS — I4949 Other premature depolarization: Secondary | ICD-10-CM

## 2012-04-22 DIAGNOSIS — H8110 Benign paroxysmal vertigo, unspecified ear: Secondary | ICD-10-CM

## 2012-04-22 DIAGNOSIS — E785 Hyperlipidemia, unspecified: Secondary | ICD-10-CM

## 2012-04-22 LAB — LIPID PANEL
Cholesterol: 204 mg/dL — ABNORMAL HIGH (ref 0–200)
Total CHOL/HDL Ratio: 3
Triglycerides: 250 mg/dL — ABNORMAL HIGH (ref 0.0–149.0)
VLDL: 50 mg/dL — ABNORMAL HIGH (ref 0.0–40.0)

## 2012-04-22 LAB — CBC WITH DIFFERENTIAL/PLATELET
Basophils Absolute: 0 10*3/uL (ref 0.0–0.1)
Eosinophils Relative: 2.3 % (ref 0.0–5.0)
Lymphocytes Relative: 29.3 % (ref 12.0–46.0)
Monocytes Relative: 6.6 % (ref 3.0–12.0)
Neutrophils Relative %: 61.2 % (ref 43.0–77.0)
Platelets: 334 10*3/uL (ref 150.0–400.0)
RDW: 12.9 % (ref 11.5–14.6)
WBC: 6.5 10*3/uL (ref 4.5–10.5)

## 2012-04-22 LAB — COMPREHENSIVE METABOLIC PANEL
ALT: 18 U/L (ref 0–35)
AST: 23 U/L (ref 0–37)
CO2: 27 mEq/L (ref 19–32)
Chloride: 99 mEq/L (ref 96–112)
Creatinine, Ser: 0.7 mg/dL (ref 0.4–1.2)
GFR: 88.71 mL/min (ref >60.00)
Sodium: 135 mEq/L (ref 135–145)
Total Bilirubin: 1.6 mg/dL — ABNORMAL HIGH (ref 0.3–1.2)
Total Protein: 7.5 g/dL (ref 6.0–8.3)

## 2012-04-22 LAB — HEPATIC FUNCTION PANEL
AST: 23 U/L (ref 0–37)
Albumin: 4.4 g/dL (ref 3.5–5.2)
Total Protein: 7.5 g/dL (ref 6.0–8.3)

## 2012-04-22 NOTE — Progress Notes (Signed)
Subjective:    Patient ID: Olivia Mendoza, female    DOB: Jan 26, 1946, 66 y.o.   MRN: [id number]  HPI The patient is here for annual Medicare wellness examination and management of other chronic and acute problems.   She is working through a withdrawal of HRT. She has had some breakthrough dypsepsia controlled with generic maalox with simethicone   The risk factors are reflected in the social history.  The roster of all physicians providing medical care to patient - is listed in the Snapshot section of the chart.  Activities of daily living:  The patient is 100% inedpendent in all ADLs: dressing, toileting, feeding as well as independent mobility  Home safety : The patient has smoke detectors in the home. They wear seatbelts. Fall- fall safe. No firearms at home. There is no violence in the home.   There is no risks for hepatitis, STDs or HIV. There is no   history of blood transfusion. They have no travel history to infectious disease endemic areas of the world.  The patient has seen their dentist in the last six month. They have seen their eye doctor in the last year. They deny any hearing difficulty and have not had audiologic testing in the last year.    They do not  have excessive sun exposure. Discussed the need for sun protection: hats, long sleeves and use of sunscreen if there is significant sun exposure.   Diet: the importance of a healthy diet is discussed. They do have a healthy diet.  The patient has a regular exercise program: aerobic walking on trampoline  , 20 min duration, 4 per week.  The benefits of regular aerobic exercise were discussed.  Depression screen: there are no signs or vegative symptoms of depression- irritability, change in appetite, anhedonia, sadness/tearfullness.  Cognitive assessment: the patient manages all their financial and personal affairs and is actively engaged.   The following portions of the patient's history were reviewed and updated as  appropriate: allergies, current medications, past family history, past medical history,  past surgical history, past social history  and problem list.  Vision, hearing, body mass index were assessed and reviewed.   During the course of the visit the patient was educated and counseled about appropriate screening and preventive services including : fall prevention , diabetes screening, nutrition counseling, colorectal cancer screening, and recommended immunizations. Past Medical History  Diagnosis Date  . MVP (mitral valve prolapse)   . Hyperlipemia   . Routine general medical examination at a health care facility   . Dysphagia   . History of cystopexy   . Positional vertigo   . Mononucleosis   . Fibrocystic breast disease   . PVC (premature ventricular contraction)   . IBS (irritable bowel syndrome)   . History of migraines    Past Surgical History  Procedure Date  . Vaginal hysterectomy 92  . Laparoscopic tubal ligation     Bilateral  . Biopsy thyroid    Family History  Problem Relation Age of Onset  . Hypertension Mother   . Hyperlipidemia Mother   . Stroke Mother   . Hiatal hernia Mother   . Arthritis Mother   . Heart disease Mother     PTVDP-ss syndrome (Dr Lynnea Ferrier)  . Hemochromatosis Father   . Hyperlipidemia Father   . Hypertension Father   . Heart disease Father     CAD/CABG  . Heart disease Brother     AAA Stent, PVD s/p stent, CAD, lipids   History  Social History  . Marital Status: Divorced    Spouse Name: N/A    Number of Children: N/A  . Years of Education: N/A   Occupational History  . [employer]    Social History Main Topics  . Smoking status: Never Smoker   . Smokeless tobacco: Not on file  . Alcohol Use: Not on file  . Drug Use: Not on file  . Sexually Active: Not on file   Other Topics Concern  . Not on file   Social History Narrative   HSG, [employer] school.  Married 41yrs - divorced.  2 daughters, 1 son died 10 days  after birth, 5 grandchildrenLive alone - independently, caregiver with mother. Work - continues to work for [employer].    Current Outpatient Prescriptions on File Prior to Visit  Medication Sig Dispense Refill  . aspirin 81 MG EC tablet Take 81 mg by mouth daily.        . Cholecalciferol (VITAMIN D) 2000 UNITS CAPS Take by mouth daily.        [name] ezetimibe-simvastatin (VYTORIN) 10-20 MG per tablet Take 1 tablet by mouth at bedtime.  90 tablet  0  . Flaxseed, Linseed, (FLAX SEED OIL) 1000 MG CAPS Take by mouth daily.        . multivitamins-fortified A-D-K (SOURCECF) solution Take 0.5 mLs by mouth daily.        . Psyllium (METAMUCIL) 30.9 % POWD Take by mouth daily.        . ranitidine (ZANTAC) 150 MG tablet Take 150-300 mg by mouth daily.        [name] estradiol (VIVELLE-DOT) 0.05 MG/24HR Place 1 patch onto the skin 2 (two) times a week.        [name] DISCONTD: ezetimibe-simvastatin (VYTORIN) 10-20 MG per tablet Take 1 tablet by mouth at bedtime.  30 tablet  1  . DISCONTD: VYTORIN 10-20 MG per tablet TAKE 1 TABLET EVERY EVENING  30 tablet  1      Review of Systems Constitutional:  Negative for fever, chills, activity change and unexpected weight change.  HEENT:  Negative for hearing loss, ear pain, congestion, neck stiffness and postnasal drip. Negative for sore throat or swallowing problems. Negative for dental complaints.   Eyes: Negative for vision loss or change in visual acuity.  Respiratory: Negative for chest tightness and wheezing. Negative for DOE.   Cardiovascular: Negative for chest pain or palpitations. No decreased exercise tolerance Gastrointestinal: No change in bowel habit. No bloating or gas. No reflux or indigestion Genitourinary: Negative for urgency, frequency, flank pain and difficulty urinating.  Musculoskeletal: Negative for myalgias, back pain, arthralgias and gait problem.  Neurological: Negative for dizziness, tremors, weakness and headaches.  Hematological:  Negative for adenopathy.  Psychiatric/Behavioral: Negative for behavioral problems and dysphoric mood.       Objective:   Physical Exam Filed Vitals:   04/22/12 1353  BP: 140/68  Pulse: 72  Temp: 98.3 F (36.8 C)  Resp: 16   Weight: 166 lb (75.297 kg)  gen'l- WNWD mildly overweight white woman in no distress HEENT- C&S clear, PERRLA, EOMI, EACs clear, TMs normal, oropharynx w/o lesions, dentition in good repair, throat clear Neck - supple, no thyromegaly Nodes - neg submandibular, cervical, supraclavicular Cor - 2+ radial and DP pulse, RRR, no JVD, no Carotid bruits Pulm - normal respirations, no rales or wheezes Breast-deferred to gyn Abd - soft, BS +, no guarding or rebound Pelvic - deferred to gyn Ext- no C/C/E, no deformity,  nl ROM about small, medium and large joints Neuro - A&O x 3, CN II-XII normal, MS normal, Cerebellar function - normal gait and balance. Derm- no visible lesions face, neck, back, arms.   Labs are pending         Assessment & Plan:

## 2012-04-23 ENCOUNTER — Encounter: Payer: Self-pay | Admitting: Internal Medicine

## 2012-04-23 DIAGNOSIS — Z0000 Encounter for general adult medical examination without abnormal findings: Secondary | ICD-10-CM | POA: Insufficient documentation

## 2012-04-23 NOTE — Assessment & Plan Note (Signed)
Labs ordered and pending. Recommendation to follow on results. Has previously been well controlled on present medications.  Plan- Continue vytorin at current dose unless labs reveal change in control.

## 2012-04-23 NOTE — Assessment & Plan Note (Signed)
Generally well controlled with diet and fiber.  Plan - consider adding a bulk laxative on a daily basis.

## 2012-04-23 NOTE — Assessment & Plan Note (Signed)
Interval history is benign. Physical exam, sans breast and pelvic, is normal. Lab results pending. She is current with colorectal and breast cancer screening. Immunizations are up to date.  In summary - a very nice woman who appears to be medically stable. She will consider increasing her regular exercise. She will return in 1 year or sooner as needed.

## 2012-04-23 NOTE — Assessment & Plan Note (Signed)
Rare problem and not symptomatic.

## 2012-04-23 NOTE — Assessment & Plan Note (Signed)
Remains a symptom but she has had no falls.  Plan - exercise program to include flex/stretch/balance

## 2012-05-14 ENCOUNTER — Telehealth: Payer: Self-pay | Admitting: Internal Medicine

## 2012-05-14 NOTE — Telephone Encounter (Signed)
Caller: Tyreesha/Patient; Patient Name: Olivia Mendoza; PCP: Illene Regulus (Adults only); Best Callback Phone Number: [phone]. Tick Bite;  05/13/12.  Tick was very engorged.  It was on left lower abdomen.  Afebrile/subjective.   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 Cliffton Asters that she may need antibiotic.  She states she cannot swallow large pills/ capsules and prefers liquid medications.  Hematoma larger than pencil eraser size.  Declined appointment on 9/4 due to conflict with dental appointment.  Appointment with Dr. Debby Bud on 05/15/12 @ 1600.

## 2012-05-15 ENCOUNTER — Ambulatory Visit (INDEPENDENT_AMBULATORY_CARE_PROVIDER_SITE_OTHER): Payer: BC Managed Care – PPO | Admitting: Internal Medicine

## 2012-05-15 ENCOUNTER — Encounter: Payer: Self-pay | Admitting: Internal Medicine

## 2012-05-15 VITALS — BP 144/80 | HR 61 | Temp 98.2°F | Resp 16 | Wt 164.0 lb

## 2012-05-15 DIAGNOSIS — S30860A Insect bite (nonvenomous) of lower back and pelvis, initial encounter: Secondary | ICD-10-CM

## 2012-05-15 DIAGNOSIS — W57XXXA Bitten or stung by nonvenomous insect and other nonvenomous arthropods, initial encounter: Secondary | ICD-10-CM

## 2012-05-15 DIAGNOSIS — T148XXA Other injury of unspecified body region, initial encounter: Secondary | ICD-10-CM

## 2012-05-15 NOTE — Progress Notes (Signed)
Subjective:     Patient ID: Olivia Mendoza, female   DOB: April 26, 1946, 66 y.o.   MRN: [id number]  HPI Comments: Olivia Mendoza is a 66 yo female who has come to clinic today for evaluation of a tick bite. She removed the tick from her left hip on the morning of 05/13/2012. She has brought the tick with her. She denies pain at the bite area and fever. She also denies joint pain. She reports no skin rash but endorses that it left a bruise at the sight of the bite. She has requested doxycycline because she is concerned that it is [place] spotted fever. She also reports that she has a headache that is described as a tightness in the back of her head. She has had these headaches before which she believes may be related to sinus congestion from seasonal allergies. She has never taken doxycycline before.  Past Medical History  Diagnosis Date  . MVP (mitral valve prolapse)   . Hyperlipemia   . Routine general medical examination at a health care facility   . Dysphagia   . History of cystopexy   . Positional vertigo   . Mononucleosis   . Fibrocystic breast disease   . PVC (premature ventricular contraction)   . IBS (irritable bowel syndrome)   . History of migraines    Past Surgical History  Procedure Date  . Vaginal hysterectomy 92  . Laparoscopic tubal ligation     Bilateral  . Biopsy thyroid    Family History  Problem Relation Age of Onset  . Hypertension Mother   . Hyperlipidemia Mother   . Stroke Mother   . Hiatal hernia Mother   . Arthritis Mother   . Heart disease Mother     PTVDP-ss syndrome (Dr Lynnea Ferrier)  . Hemochromatosis Father   . Hyperlipidemia Father   . Hypertension Father   . Heart disease Father     CAD/CABG  . Heart disease Brother     AAA Stent, PVD s/p stent, CAD, lipids   History   Social History  . Marital Status: Divorced    Spouse Name: N/A    Number of Children: N/A  . Years of Education: N/A   Occupational History  .  Path    Social  History Main Topics  . Smoking status: Never Smoker   . Smokeless tobacco: Never Used  . Alcohol Use: Not on file  . Drug Use: Not on file  . Sexually Active: Not on file   Other Topics Concern  . Not on file   Social History Narrative   HSG, [employer] school.  Married 73yrs - divorced.  2 daughters, 1 son died 10 days after birth, 5 grandchildrenLive alone - independently, caregiver with mother. Work - continues to work for [employer].    Current Outpatient Prescriptions on File Prior to Visit  Medication Sig Dispense Refill  . alum & mag hydroxide-simeth (MAALOX/MYLANTA) 200-200-20 MG/5ML suspension Take 10 mLs by mouth at bedtime.      [name] aspirin 81 MG EC tablet Take 81 mg by mouth daily.        . Cholecalciferol (VITAMIN D) 2000 UNITS CAPS Take by mouth daily.        . Estradiol (ESTRACE VA) Place vaginally. Use 1/2 to 1 gram in vagina 3 x week      . estradiol (VIVELLE-DOT) 0.025 MG/24HR Place 1 patch onto the skin. Place one .025mg  once a week      . ezetimibe-simvastatin (VYTORIN) 10-20  MG per tablet Take 1 tablet by mouth at bedtime.  90 tablet  0  . Flaxseed, Linseed, (FLAX SEED OIL) 1000 MG CAPS Take by mouth daily.        . multivitamins-fortified A-D-K (SOURCECF) solution Take 0.5 mLs by mouth daily.        . Psyllium (METAMUCIL) 30.9 % POWD Take by mouth daily.        . ranitidine (ZANTAC) 150 MG tablet Take 150-300 mg by mouth daily.        [name] DISCONTD: estradiol (VIVELLE-DOT) 0.05 MG/24HR Place 1 patch onto the skin 2 (two) times a week.           Review of Systems  All other systems reviewed and are negative.       Objective:   Physical Exam  Constitutional: She is oriented to person, place, and time. No distress.  Pulmonary/Chest: Effort normal. No respiratory distress.  Neurological: She is alert and oriented to person, place, and time.  Skin: Skin is warm and intact. She is not diaphoretic.     Psychiatric: She has a normal mood and  affect. Her behavior is normal. Judgment and thought content normal.       Assessment & Plan:     1. Tick bite - not likely lyme disease given area. Patient has no skin rash or any signs or symptoms of [facility] Spotted Fever. Likely benign tick bite.  Plan - Advise patient to return if she experiences fever, severe headaches, or skin rash. 2. Headaches - likely due to allergies given previous history of headaches and severity.  Plan - No need to treat acutely.      Attending note: interviewed and examined patient. She has a small bruise where she removed a tick. She has had no findings or symptoms to suggest tick transmitted disease. She is reassured.

## 2012-05-16 ENCOUNTER — Other Ambulatory Visit: Payer: Self-pay | Admitting: Internal Medicine

## 2013-01-30 ENCOUNTER — Other Ambulatory Visit: Payer: Self-pay

## 2013-01-30 DIAGNOSIS — Z1231 Encounter for screening mammogram for malignant neoplasm of breast: Secondary | ICD-10-CM

## 2013-04-12 ENCOUNTER — Other Ambulatory Visit: Payer: Self-pay | Admitting: Internal Medicine

## 2013-04-14 ENCOUNTER — Other Ambulatory Visit: Payer: Self-pay | Admitting: Internal Medicine

## 2013-04-27 ENCOUNTER — Ambulatory Visit (INDEPENDENT_AMBULATORY_CARE_PROVIDER_SITE_OTHER): Payer: PRIVATE HEALTH INSURANCE | Admitting: Internal Medicine

## 2013-04-27 ENCOUNTER — Encounter: Payer: Self-pay | Admitting: Internal Medicine

## 2013-04-27 ENCOUNTER — Ambulatory Visit
Admission: RE | Admit: 2013-04-27 | Discharge: 2013-04-27 | Disposition: A | Discharge status: Home / Self Care | Payer: PRIVATE HEALTH INSURANCE | Source: Ambulatory Visit

## 2013-04-27 ENCOUNTER — Ambulatory Visit (INDEPENDENT_AMBULATORY_CARE_PROVIDER_SITE_OTHER): Payer: PRIVATE HEALTH INSURANCE

## 2013-04-27 VITALS — BP 140/80 | HR 60 | Temp 98.1°F | Ht 66.75 in | Wt 163.0 lb

## 2013-04-27 DIAGNOSIS — R1310 Dysphagia, unspecified: Secondary | ICD-10-CM

## 2013-04-27 DIAGNOSIS — Z87898 Personal history of other specified conditions: Secondary | ICD-10-CM

## 2013-04-27 DIAGNOSIS — E785 Hyperlipidemia, unspecified: Secondary | ICD-10-CM

## 2013-04-27 DIAGNOSIS — I4949 Other premature depolarization: Secondary | ICD-10-CM

## 2013-04-27 DIAGNOSIS — Z1231 Encounter for screening mammogram for malignant neoplasm of breast: Secondary | ICD-10-CM

## 2013-04-27 DIAGNOSIS — Z0000 Encounter for general adult medical examination without abnormal findings: Secondary | ICD-10-CM

## 2013-04-27 LAB — LIPID PANEL
Cholesterol: 187 mg/dL (ref 0–200)
HDL: 60.3 mg/dL (ref >39.00)
Total CHOL/HDL Ratio: 3
Triglycerides: 203 mg/dL — ABNORMAL HIGH (ref 0.0–149.0)

## 2013-04-27 LAB — COMPREHENSIVE METABOLIC PANEL
AST: 20 U/L (ref 0–37)
Alkaline Phosphatase: 75 U/L (ref 39–117)
BUN: 15 mg/dL (ref 6–23)
Calcium: 9.6 mg/dL (ref 8.4–10.5)
Creatinine, Ser: 0.7 mg/dL (ref 0.4–1.2)
Glucose, Bld: 88 mg/dL (ref 70–99)

## 2013-04-27 LAB — HEPATIC FUNCTION PANEL
ALT: 19 U/L (ref 0–35)
Albumin: 4.2 g/dL (ref 3.5–5.2)
Total Protein: 7.5 g/dL (ref 6.0–8.3)

## 2013-04-27 NOTE — Assessment & Plan Note (Signed)
Ms. Splinter reports that she has learned to live with this.  This is well-controlled with careful and slow eating.  Plan: Follow up if it becomes problematic.

## 2013-04-27 NOTE — Assessment & Plan Note (Addendum)
Ms. Belgrave is a pleasant and healthy 67 year-old lady. She is current on all her screening tests and vaccinations.   She was advised to schedule a flu shot when we approach flu season.

## 2013-04-27 NOTE — Assessment & Plan Note (Signed)
These are intermittent and do not produce symptoms Plan: monitor for any change.  These will be addressed if they become symptomatic.

## 2013-04-27 NOTE — Assessment & Plan Note (Signed)
These have not bothered Olivia Mendoza for several years. She reports occasional discomfort but states that it does not bother her. Plan: Schedule a visit if they become bothersome.

## 2013-04-27 NOTE — Progress Notes (Signed)
Subjective:     Patient ID: Olivia Mendoza, female   DOB: 31-Dec-1945, 67 y.o.   MRN: [id number]  HPI The patient is here for annual Medicare wellness examination and management of other chronic and acute problems.  She has no acute problems.   The risk factors are reflected in the social history.  The roster of all physicians providing medical care to patient - is listed in the Snapshot section of the chart.  Activities of daily living:  The patient is 100% inedpendent in all ADLs: dressing, toileting, feeding as well as independent mobility.  Home safety : The patient has smoke detectors in the home. They wear seatbelts. No firearms at home. There is no violence in the home.   There is no risks for hepatitis, STDs or HIV. There is no history of blood transfusion. They have no travel history to infectious disease endemic areas of the world.  The patient has seen their dentist in the last six month. They have seen their eye doctor in the last year. They deny any hearing difficulty and have not had audiologic testing in the last year.  They do not  have excessive sun exposure. Discussed the need for sun protection: hats, long sleeves and use of sunscreen if there is significant sun exposure.   Diet: the importance of a healthy diet is discussed. They do have a healthy diet.   The patient has not regularly exercised in four months.  Previously, she had a regular exercise program: about 20 minutes per day, 5 days per week.  The benefits of regular aerobic exercise were discussed.  Depression screen: there are no signs or vegative symptoms of depression- irritability, change in appetite, anhedonia, sadness/tearfullness.  Cognitive assessment: the patient manages all their financial and personal affairs and is actively engaged. They could relate day,date,year and events; recalled 3/3 objects at 3 minutes; performed clock-face test normally.  The following portions of the patient's history were  reviewed and updated as appropriate: allergies, current medications, past family history, past medical history,  past surgical history, past social history  and problem list.  Vision, hearing, body mass index were assessed and reviewed.   During the course of the visit the patient was educated and counseled about appropriate screening and preventive services including : fall prevention , diabetes screening, nutrition counseling, colorectal cancer screening, and recommended immunizations.  Past Medical History  Diagnosis Date   MVP (mitral valve prolapse)    Hyperlipemia    Routine general medical examination at a health care facility    Dysphagia    History of cystopexy    Positional vertigo    Mononucleosis    Fibrocystic breast disease    PVC (premature ventricular contraction)    IBS (irritable bowel syndrome)    History of migraines    Past Surgical History  Procedure Laterality Date   Vaginal hysterectomy  89   Laparoscopic tubal ligation      Bilateral   Biopsy thyroid     Family History  Problem Relation Age of Onset   Hypertension Mother    Hyperlipidemia Mother    Stroke Mother    Hiatal hernia Mother    Arthritis Mother    Heart disease Mother     PTVDP-ss syndrome (Dr Lynnea Ferrier)   Hemochromatosis Father    Hyperlipidemia Father    Hypertension Father    Heart disease Father     CAD/CABG   Heart disease Brother     AAA Stent, PVD s/p stent,  CAD, lipids   History   Social History   Marital Status: Divorced    Spouse Name: N/A    Number of Children: N/A   Years of Education: N/A   Occupational History   Research officer, political party    Social History Main Topics   Smoking status: Never Smoker    Smokeless tobacco: Never Used   Alcohol Use: Not on file   Drug Use: Not on file   Sexual Activity: Not on file   Other Topics Concern   Not on file   Social History Narrative   [employer], [employer].  Married 13yrs -  divorced.  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 Work - continues to work for [employer].              Review of Systems Constitutional: No fever, chills, activity change and unexpected weight change.  HEENT: No for hearing loss, ear pain, congestion, neck stiffness. Negative for dental complaints.  Eyes: No for vision loss or change in visual acuity.  Respiratory: Negative for chest tightness and wheezing. No DOE.  CV: Negative for chest pain or palpitations. Gastrointestinal: No change in bowel habit. No bloating or gas. Neurological: No dizziness, tremors, weakness and headaches.  Hematological: Negative for adenopathy.     Objective:   Physical Exam Ms. Schmuhl is a well-appearing 67 year-old lady in no apparent distress. HEENT: Normocephalic, atraumatic, facial sensation bilaterally intact, sinuses not bothered by percussion; EOMI, PERRL, indirect ophthalmoscopy unremarkable, ears were bilaterally externally normal, TM's were bilaterally intact, neck exhibited no lymphadenopathy, thyroid was supple CV: RRR, normal S1/S2, no murmurs/rubs/gallops Pulm: normal effort, breath sounds normal, no wheezes/rales/crackles Abdomen: bowel sounds normal, soft, non-distended, no guarding present. Extremities: normal strength, sensation and ROM    Assessment and Plan:

## 2013-04-27 NOTE — Patient Instructions (Addendum)
Thanks for working with me Olivia Mendoza) today!  Your history and physical both seemed great.  I think you are very healthy!  Hyperlipidemia: We will take a lipid panel today to assess your cholesterol. Plan: Please continue taking your ezetimibe-simvastatin (Vytorin) as directed. We will follow up with you if your lipid panel indicates any problems.  Health maintenance: please continue to follow-up with your gynecologist, dentist and ophthalmologist/optometrist.  Please sign up for a MyChart account.  This will allow [place] to communicate with you, answer your questions and send you lab results as they arrive in our office.  Instructions are attached.

## 2013-04-27 NOTE — Assessment & Plan Note (Addendum)
We will check a lipid panel today.  When last checked, hyperlipidemia was well-controlled.    Plan: keep taking the ezetimibe-simvastatin (Vytorin) as directed. We will follow-up if lipid panel findings are concerning or if medications need to be changed. Follow up in one year, sooner if you have problems.  Lipid Panel     Component Value Date/Time   CHOL 204* 04/22/2012 1437   TRIG 250.0* 04/22/2012 1437   HDL 72.50 04/22/2012 1437   CHOLHDL 3 04/22/2012 1437   VLDL 50.0* 04/22/2012 1437   LDLCALC 101* 10/28/2008 9147

## 2013-04-28 LAB — LDL CHOLESTEROL, DIRECT: Direct LDL: 100.6 mg/dL

## 2013-05-03 ENCOUNTER — Encounter: Payer: Self-pay | Admitting: Internal Medicine

## 2014-02-17 ENCOUNTER — Other Ambulatory Visit: Payer: Self-pay

## 2014-02-17 DIAGNOSIS — Z1231 Encounter for screening mammogram for malignant neoplasm of breast: Secondary | ICD-10-CM

## 2014-03-11 ENCOUNTER — Other Ambulatory Visit: Payer: Self-pay

## 2014-03-11 MED ORDER — EZETIMIBE-SIMVASTATIN 10-20 MG PO TABS: 1.0000 | ORAL_TABLET | Freq: Every day | ORAL | Status: DC

## 2014-05-14 ENCOUNTER — Other Ambulatory Visit: Payer: Self-pay | Admitting: Internal Medicine

## 2014-05-25 ENCOUNTER — Ambulatory Visit
Admission: RE | Admit: 2014-05-25 | Discharge: 2014-05-25 | Disposition: A | Discharge status: Home / Self Care | Payer: BC Managed Care – PPO | Source: Ambulatory Visit

## 2014-05-25 ENCOUNTER — Other Ambulatory Visit: Payer: Self-pay | Admitting: Obstetrics and Gynecology

## 2014-05-25 DIAGNOSIS — Z1231 Encounter for screening mammogram for malignant neoplasm of breast: Secondary | ICD-10-CM

## 2014-05-26 LAB — CYTOLOGY - PAP

## 2014-06-17 ENCOUNTER — Ambulatory Visit (INDEPENDENT_AMBULATORY_CARE_PROVIDER_SITE_OTHER): Payer: BC Managed Care – PPO | Admitting: Internal Medicine

## 2014-06-17 ENCOUNTER — Encounter: Payer: Self-pay | Admitting: Internal Medicine

## 2014-06-17 ENCOUNTER — Other Ambulatory Visit (INDEPENDENT_AMBULATORY_CARE_PROVIDER_SITE_OTHER): Payer: BC Managed Care – PPO

## 2014-06-17 VITALS — BP 150/70 | HR 83 | Temp 98.4°F | Resp 16 | Ht 67.0 in | Wt 164.4 lb

## 2014-06-17 DIAGNOSIS — E785 Hyperlipidemia, unspecified: Secondary | ICD-10-CM

## 2014-06-17 DIAGNOSIS — N6019 Diffuse cystic mastopathy of unspecified breast: Secondary | ICD-10-CM

## 2014-06-17 DIAGNOSIS — R011 Cardiac murmur, unspecified: Secondary | ICD-10-CM

## 2014-06-17 DIAGNOSIS — Z0000 Encounter for general adult medical examination without abnormal findings: Secondary | ICD-10-CM

## 2014-06-17 DIAGNOSIS — Z23 Encounter for immunization: Secondary | ICD-10-CM

## 2014-06-17 DIAGNOSIS — R1310 Dysphagia, unspecified: Secondary | ICD-10-CM

## 2014-06-17 DIAGNOSIS — R010 Benign and innocent cardiac murmurs: Secondary | ICD-10-CM

## 2014-06-17 DIAGNOSIS — K589 Irritable bowel syndrome without diarrhea: Secondary | ICD-10-CM

## 2014-06-17 LAB — LIPID PANEL
Cholesterol: 199 mg/dL (ref 0–200)
HDL: 53.8 mg/dL (ref >39.00)
LDL CALC: 108 mg/dL — AB (ref 0–99)
NonHDL: 145.2
Total CHOL/HDL Ratio: 4
Triglycerides: 188 mg/dL — ABNORMAL HIGH (ref 0.0–149.0)
VLDL: 37.6 mg/dL (ref 0.0–40.0)

## 2014-06-17 LAB — BASIC METABOLIC PANEL
BUN: 18 mg/dL (ref 6–23)
CO2: 28 meq/L (ref 19–32)
CREATININE: 0.7 mg/dL (ref 0.4–1.2)
Calcium: 9.4 mg/dL (ref 8.4–10.5)
Chloride: 103 mEq/L (ref 96–112)
GFR: 82.67 mL/min (ref >60.00)
GLUCOSE: 92 mg/dL (ref 70–99)
Potassium: 4.2 mEq/L (ref 3.5–5.1)
Sodium: 138 mEq/L (ref 135–145)

## 2014-06-17 NOTE — Patient Instructions (Signed)
You are doing well with your health. Come back in about 1 year or sooner if you are sick or have any problems.  We have sent in your refills.   Work on exercising or walking a few times per week if you are able to keep yourself healthy. We have given you the flu shot today.Exercise to Stay Healthy Exercise helps you become and stay healthy. EXERCISE IDEAS AND TIPS Choose exercises that:  You enjoy.  Fit into your day. You do not need to exercise really hard to be healthy. You can do exercises at a slow or medium level and stay healthy. You can:  Stretch before and after working out.  Try yoga, Pilates, or tai chi.  Lift weights.  Walk fast, swim, jog, run, climb stairs, bicycle, dance, or rollerskate.  Take aerobic classes. Exercises that burn about 150 calories:  Running 1  miles in 15 minutes.  Playing volleyball for 45 to 60 minutes.  Washing and waxing a car for 45 to 60 minutes.  Playing touch football for 45 minutes.  Walking 1  miles in 35 minutes.  Pushing a stroller 1  miles in 30 minutes.  Playing basketball for 30 minutes.  Raking leaves for 30 minutes.  Bicycling 5 miles in 30 minutes.  Walking 2 miles in 30 minutes.  Dancing for 30 minutes.  Shoveling snow for 15 minutes.  Swimming laps for 20 minutes.  Walking up stairs for 15 minutes.  Bicycling 4 miles in 15 minutes.  Gardening for 30 to 45 minutes.  Jumping rope for 15 minutes.  Washing windows or floors for 45 to 60 minutes. Document Released: 09/29/2010 Document Revised: 11/19/2011 Document Reviewed: 09/29/2010 [facility] Patient Information 2015 [place], [place]. This information is not intended to replace advice given to you by your health care provider. Make sure you discuss any questions you have with your health care provider.

## 2014-06-17 NOTE — Assessment & Plan Note (Signed)
She does not swallow pills well if they are too oblong or fat but is able to eat without problems and does not wish to pursue this at this time.

## 2014-06-17 NOTE — Assessment & Plan Note (Signed)
Appears to be benign given last echo (2011) normal. She likely does not need antibiotic ppx before dental procedures but she says her dentist will not change.

## 2014-06-17 NOTE — Assessment & Plan Note (Signed)
- 

## 2014-06-17 NOTE — Assessment & Plan Note (Signed)
She continues to take vytorin but is not doing as well with her exercise at this time with the extra demands from work and home. Encouraged her to take some time for herself if she is able to walk or do some activity.

## 2014-06-17 NOTE — Progress Notes (Signed)
Pre visit review using our clinic review tool, if applicable. No additional management support is needed unless otherwise documented below in the visit note. 

## 2014-06-17 NOTE — Progress Notes (Signed)
   Subjective:    Patient ID: Olivia Mendoza, female    DOB: August 20, 1946, 68 y.o.   MRN: [id number]  HPI The patient is a 68 YO female who is coming in today to establish care. She is not having any problems. She has PMH of hyperlipidemia, heart murmur (benign ECHO 2011 without cause or abnormality), dysphagia with pills. She is not having any problems at this time. She has a finger that has some arthritis that she works with a lot. 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 She denies chest pains, SOB, abdominal pain. She uses metamucil and flaxseed oil to keep herself regular. She denies any problems with her balance or hearing. She is a little stressed right now as her mother had a UTI and had some psychotic symptoms and is now at [facility].   Review of Systems  Constitutional: Negative for fever, activity change, appetite change and fatigue.  HENT: Negative.   Eyes: Negative.   Respiratory: Negative for cough, chest tightness, shortness of breath and wheezing.   Cardiovascular: Negative for chest pain, palpitations and leg swelling.  Gastrointestinal: Negative for abdominal pain, diarrhea, constipation and abdominal distention.  Genitourinary: Negative.   Musculoskeletal: Positive for arthralgias. Negative for gait problem and myalgias.  Skin: Negative.   Neurological: Negative.       Objective:   Physical Exam  Constitutional: She is oriented to person, place, and time. She appears well-developed and well-nourished.  HENT:  Head: Normocephalic and atraumatic.  Eyes: EOM are normal.  Neck: Normal range of motion.  Cardiovascular: Normal rate and regular rhythm.   Pulmonary/Chest: Effort normal and breath sounds normal. No respiratory distress. She has no wheezes. She has no rales.  Abdominal: Soft. Bowel sounds are normal.  Neurological: She is alert and oriented to person, place, and time. Coordination normal.    Skin: Skin is warm and dry.   Filed Vitals:   06/17/14 0806  BP: 150/70  Pulse: 83  Temp: 98.4 F (36.9 C)  TempSrc: Oral  Resp: 16  Height: 5\' 7"  (1.702 m)  Weight: 164 lb 6.4 oz (74.571 kg)  SpO2: 97%      Assessment & Plan:

## 2014-06-17 NOTE — Assessment & Plan Note (Signed)
She does well with occasional ranitidine and metamucil and flaxseed oil daily to keep herself regular. No bowel changes at this time.

## 2014-06-17 NOTE — Assessment & Plan Note (Signed)
Last PAP smear 9/15 and normal, she has had history of abnormal prior to hysterectomy and after so she still follows with gynecology for PAP smear. Flu shot given today and reminded her to exercise if she is able.

## 2014-07-12 ENCOUNTER — Encounter: Payer: Self-pay | Admitting: Internal Medicine

## 2014-07-13 ENCOUNTER — Other Ambulatory Visit: Payer: Self-pay | Admitting: Internal Medicine

## 2014-09-17 ENCOUNTER — Other Ambulatory Visit: Payer: Self-pay | Admitting: Geriatric Medicine

## 2014-09-17 MED ORDER — EZETIMIBE-SIMVASTATIN 10-20 MG PO TABS: 1.0000 | ORAL_TABLET | Freq: Every day | ORAL | Status: DC

## 2014-11-12 ENCOUNTER — Other Ambulatory Visit: Payer: Self-pay | Admitting: Geriatric Medicine

## 2014-11-15 ENCOUNTER — Other Ambulatory Visit: Payer: Self-pay | Admitting: Geriatric Medicine

## 2014-11-15 MED ORDER — EZETIMIBE-SIMVASTATIN 10-20 MG PO TABS: 1.0000 | ORAL_TABLET | Freq: Every day | ORAL | Status: DC

## 2014-12-27 ENCOUNTER — Encounter: Payer: Self-pay | Admitting: Internal Medicine

## 2014-12-27 ENCOUNTER — Ambulatory Visit (INDEPENDENT_AMBULATORY_CARE_PROVIDER_SITE_OTHER): Payer: BLUE CROSS/BLUE SHIELD | Admitting: Internal Medicine

## 2014-12-27 VITALS — BP 136/76 | HR 85 | Temp 98.9°F | Resp 16 | Wt 167.0 lb

## 2014-12-27 DIAGNOSIS — B349 Viral infection, unspecified: Secondary | ICD-10-CM | POA: Diagnosis not present

## 2014-12-27 MED ORDER — BENZONATATE 100 MG PO CAPS: 100.0000 mg | ORAL_CAPSULE | Freq: Two times a day (BID) | ORAL | Status: DC | PRN

## 2014-12-27 MED ORDER — FLUTICASONE PROPIONATE 50 MCG/ACT NA SUSP: 2.0000 | Freq: Every day | NASAL | Status: DC

## 2014-12-27 NOTE — Progress Notes (Signed)
Pre visit review using our clinic review tool, if applicable. No additional management support is needed unless otherwise documented below in the visit note. 

## 2014-12-27 NOTE — Assessment & Plan Note (Signed)
Negative for flu in the office today. Given rx for flonase for the congestion and tessalon perles for cough. No fevers. No indication for tamiflu or antibiotics. Given information about OTC remedies for her symptoms.

## 2014-12-27 NOTE — Patient Instructions (Signed)
We have checked the flu swab today which was negative. We do think that you likely have some other kind of virus. We have sent in a nose spray which helps to dry up the sinuses. It is called flonase. Use 2 puffs in each nostril twice a day the first 3 days then once a day after that. I would take it for 2-3 weeks or until you are feeling 100%.   We have also sent in tessalon perles for the cough which you can take 1 up to 3 times a day.   Upper Respiratory Infection, Adult An upper respiratory infection (URI) is also sometimes known as the common cold. The upper respiratory tract includes the nose, sinuses, throat, trachea, and bronchi. Bronchi are the airways leading to the lungs. Most people improve within 1 week, but symptoms can last up to 2 weeks. A residual cough may last even longer.  CAUSES Many different viruses can infect the tissues lining the upper respiratory tract. The tissues become irritated and inflamed and often become very moist. Mucus production is also common. A cold is contagious. You can easily spread the virus to others by oral contact. This includes kissing, sharing a glass, coughing, or sneezing. Touching your mouth or nose and then touching a surface, which is then touched by another person, can also spread the virus. SYMPTOMS  Symptoms typically develop 1 to 3 days after you come in contact with a cold virus. Symptoms vary from person to person. They may include:  Runny nose.  Sneezing.  Nasal congestion.  Sinus irritation.  Sore throat.  Loss of voice (laryngitis).  Cough.  Fatigue.  Muscle aches.  Loss of appetite.  Headache.  Low-grade fever. DIAGNOSIS  You might diagnose your own cold based on familiar symptoms, since most people get a cold 2 to 3 times a year. Your caregiver can confirm this based on your exam. Most importantly, your caregiver can check that your symptoms are not due to another disease such as strep throat, sinusitis, pneumonia,  asthma, or epiglottitis. Blood tests, throat tests, and X-rays are not necessary to diagnose a common cold, but they may sometimes be helpful in excluding other more serious diseases. Your caregiver will decide if any further tests are required. RISKS AND COMPLICATIONS  You may be at risk for a more severe case of the common cold if you smoke cigarettes, have chronic heart disease (such as heart failure) or lung disease (such as asthma), or if you have a weakened immune system. The very young and very old are also at risk for more serious infections. Bacterial sinusitis, middle ear infections, and bacterial pneumonia can complicate the common cold. The common cold can worsen asthma and chronic obstructive pulmonary disease (COPD). Sometimes, these complications can require emergency medical care and may be life-threatening. PREVENTION  The best way to protect against getting a cold is to practice good hygiene. Avoid oral or hand contact with people with cold symptoms. Wash your hands often if contact occurs. There is no clear evidence that vitamin C, vitamin E, echinacea, or exercise reduces the chance of developing a cold. However, it is always recommended to get plenty of rest and practice good nutrition. TREATMENT  Treatment is directed at relieving symptoms. There is no cure. Antibiotics are not effective, because the infection is caused by a virus, not by bacteria. Treatment may include:  Increased fluid intake. Sports drinks offer valuable electrolytes, sugars, and fluids.  Breathing heated mist or steam (vaporizer or shower).  Eating chicken soup or other clear broths, and maintaining good nutrition.  Getting plenty of rest.  Using gargles or lozenges for comfort.  Controlling fevers with ibuprofen or acetaminophen as directed by your caregiver.  Increasing usage of your inhaler if you have asthma. Zinc gel and zinc lozenges, taken in the first 24 hours of the common cold, can shorten the  duration and lessen the severity of symptoms. Pain medicines may help with fever, muscle aches, and throat pain. A variety of non-prescription medicines are available to treat congestion and runny nose. Your caregiver can make recommendations and may suggest nasal or lung inhalers for other symptoms.  HOME CARE INSTRUCTIONS   Only take over-the-counter or prescription medicines for pain, discomfort, or fever as directed by your caregiver.  Use a warm mist humidifier or inhale steam from a shower to increase air moisture. This may keep secretions moist and make it easier to breathe.  Drink enough water and fluids to keep your urine clear or pale yellow.  Rest as needed.  Return to work when your temperature has returned to normal or as your caregiver advises. You may need to stay home longer to avoid infecting others. You can also use a face mask and careful hand washing to prevent spread of the virus. SEEK MEDICAL CARE IF:   After the first few days, you feel you are getting worse rather than better.  You need your caregiver's advice about medicines to control symptoms.  You develop chills, worsening shortness of breath, or brown or red sputum. These may be signs of pneumonia.  You develop yellow or brown nasal discharge or pain in the face, especially when you bend forward. These may be signs of sinusitis.  You develop a fever, swollen neck glands, pain with swallowing, or white areas in the back of your throat. These may be signs of strep throat. SEEK IMMEDIATE MEDICAL CARE IF:   You have a fever.  You develop severe or persistent headache, ear pain, sinus pain, or chest pain.  You develop wheezing, a prolonged cough, cough up blood, or have a change in your usual mucus (if you have chronic lung disease).  You develop sore muscles or a stiff neck. Document Released: 02/20/2001 Document Revised: 11/19/2011 Document Reviewed: 12/02/2013 [facility] Patient Information 2015 [place],  [place]. This information is not intended to replace advice given to you by your health care provider. Make sure you discuss any questions you have with your health care provider.

## 2014-12-27 NOTE — Progress Notes (Signed)
   Subjective:    Patient ID: Olivia Mendoza, female    DOB: 12-01-1945, 69 y.o.   MRN: [id number]  HPI The patient is a coming in for flulike symptoms. 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 She would like to be tested. Her symptoms started Sunday. She is having some muscle aches, drainage, cough. No fevers, mild sinus fullness. No sinus tenderness, ear pain or drainage. Denies SOB, chest pains. Has tried some aspirin over the weekend which helped some.   Review of Systems  Constitutional: Positive for fatigue. Negative for fever, chills, activity change, appetite change and unexpected weight change.  HENT: Positive for congestion, postnasal drip and rhinorrhea. Negative for ear discharge, ear pain, sinus pressure, sore throat and trouble swallowing.   Respiratory: Positive for cough. Negative for chest tightness, shortness of breath and wheezing.   Cardiovascular: Negative for chest pain, palpitations and leg swelling.  Gastrointestinal: Negative.   Musculoskeletal: Positive for myalgias.      Objective:   Physical Exam  Constitutional: She is oriented to person, place, and time. She appears well-developed and well-nourished.  HENT:  Head: Normocephalic and atraumatic.  Right Ear: External ear normal.  Left Ear: External ear normal.  Nose: Nose normal.  Oropharynx with mild erythema and clear drainage  Eyes: EOM are normal.  Neck: Normal range of motion.  Cardiovascular: Normal rate and regular rhythm.   Pulmonary/Chest: Effort normal and breath sounds normal. No respiratory distress. She has no wheezes. She has no rales.  Abdominal: Soft.  Neurological: She is alert and oriented to person, place, and time.  Skin: Skin is warm and dry.   Filed Vitals:   12/27/14 1602  BP: 136/76  Pulse: 85  Temp: 98.9 F (37.2 C)  TempSrc: Oral  Resp: 16  Weight: 167 lb (75.751 kg)  SpO2: 95%      Assessment & Plan:

## 2015-03-16 ENCOUNTER — Telehealth: Payer: Self-pay | Admitting: Internal Medicine

## 2015-03-23 ENCOUNTER — Telehealth: Payer: Self-pay

## 2015-03-23 MED ORDER — SIMVASTATIN 20 MG PO TABS: 20.0000 mg | ORAL_TABLET | Freq: Every day | ORAL | Status: DC

## 2015-03-23 NOTE — Telephone Encounter (Signed)
Sent in simvastatin, she can stop vytorin when out. Recheck lipid panel in about 6 months or so.

## 2015-03-23 NOTE — Telephone Encounter (Signed)
PA required for Vytorin. Alternative medications are; Simvastatin, Atrovastatin, and Pravastatin. Proceed with PA?

## 2015-03-23 NOTE — Telephone Encounter (Signed)
Pt advised and will further discuss with PCP at CPE in September

## 2015-03-24 ENCOUNTER — Telehealth: Payer: Self-pay

## 2015-03-24 NOTE — Telephone Encounter (Signed)
Informed pt of note, she agreed to due as Dr. Wayne Sever instructed. Has concerns from steering away from [place] since being on it for so many years. She stated she has her physical coming up in two months and will get that lab work done in same visit

## 2015-03-24 NOTE — Telephone Encounter (Signed)
Pharm stated Ms Jull has been waited on Vytorin to be filled but ins would not cover. Dr. Wayne Sever sent in [place] instead. Pt informed pharm that Symvastatin alone did not lower cholesterol levels. Pharm has called and stated Vytorin is Symvastatin and Zeita together. Pharm would like to know if Dr. Wayne Sever can send in separate script for Zetia and pt can take both to compensate for Vytorin.

## 2015-03-24 NOTE — Telephone Encounter (Signed)
We will have her try simvastatin alone for 2 months then return to the lab for fasting lipid panel. If changes from prior will send in zetia rx.

## 2015-04-08 ENCOUNTER — Encounter: Payer: Self-pay | Admitting: Gastroenterology

## 2015-06-06 ENCOUNTER — Other Ambulatory Visit: Payer: Self-pay

## 2015-06-06 DIAGNOSIS — Z1231 Encounter for screening mammogram for malignant neoplasm of breast: Secondary | ICD-10-CM

## 2015-06-21 ENCOUNTER — Encounter: Payer: BLUE CROSS/BLUE SHIELD | Admitting: Internal Medicine

## 2015-07-04 DIAGNOSIS — Z23 Encounter for immunization: Secondary | ICD-10-CM | POA: Diagnosis not present

## 2015-07-12 ENCOUNTER — Ambulatory Visit
Admission: RE | Admit: 2015-07-12 | Discharge: 2015-07-12 | Disposition: A | Discharge status: Home / Self Care | Payer: Medicare Other | Source: Ambulatory Visit

## 2015-07-12 DIAGNOSIS — Z1231 Encounter for screening mammogram for malignant neoplasm of breast: Secondary | ICD-10-CM | POA: Diagnosis not present

## 2015-08-03 ENCOUNTER — Telehealth: Payer: Self-pay

## 2015-08-03 NOTE — Telephone Encounter (Signed)
Call to schedule AWV; STated that her 70 yo mother was in snf post fx to ankle; May find out in Dec if she is weight bearing and if not, may have to take her home;  Is concerned about being NPO for lipid draw the am of her apt. 12/12 at 10:30.  I recommended she be NPO but stated I would check with you; Do you want to order future labs to be drawn prior to exam on the 12th; Her last lipid was 06/17/2014;

## 2015-08-08 ENCOUNTER — Telehealth: Payer: Self-pay

## 2015-08-08 NOTE — Telephone Encounter (Signed)
If she does not feel able to fast that's fine but she would need to come back another morning for labs.

## 2015-08-08 NOTE — Telephone Encounter (Signed)
Call to Olivia Mendoza to fup regarding NPO; States she may not be NPO for exam but can come back the next day for blood draw. Reviewed Dexa scan and is going to have her GYN exam prior and will ask her GYN if she needs dexa. Also mentioned prevnar. Will see Dr. Sharlet Salina as planned. Will call and come in for AWV prior if mother is stable and she has time.

## 2015-08-12 DIAGNOSIS — Z1272 Encounter for screening for malignant neoplasm of vagina: Secondary | ICD-10-CM | POA: Diagnosis not present

## 2015-08-12 DIAGNOSIS — Z124 Encounter for screening for malignant neoplasm of cervix: Secondary | ICD-10-CM | POA: Diagnosis not present

## 2015-08-12 DIAGNOSIS — Z6827 Body mass index (BMI) 27.0-27.9, adult: Secondary | ICD-10-CM | POA: Diagnosis not present

## 2015-08-22 ENCOUNTER — Ambulatory Visit (INDEPENDENT_AMBULATORY_CARE_PROVIDER_SITE_OTHER): Payer: Medicare Other | Admitting: Internal Medicine

## 2015-08-22 ENCOUNTER — Encounter: Payer: Self-pay | Admitting: Internal Medicine

## 2015-08-22 VITALS — BP 132/68 | HR 83 | Temp 97.9°F | Resp 12 | Ht 66.5 in | Wt 176.0 lb

## 2015-08-22 DIAGNOSIS — Z23 Encounter for immunization: Secondary | ICD-10-CM | POA: Diagnosis not present

## 2015-08-22 DIAGNOSIS — Z0000 Encounter for general adult medical examination without abnormal findings: Secondary | ICD-10-CM | POA: Diagnosis not present

## 2015-08-22 DIAGNOSIS — E785 Hyperlipidemia, unspecified: Secondary | ICD-10-CM

## 2015-08-22 NOTE — Progress Notes (Signed)
   Subjective:    Patient ID: Olivia Mendoza, female    DOB: [birth year], 69 y.o.   MRN: [id number]  HPI Here for new medicare wellness, no new complaints. Please see A/P for status and treatment of chronic medical problems.   Diet: heart healthy Physical activity: sedentary, was exercising more before her mom had surgery Depression/mood screen: negative Hearing: intact to whispered voice Visual acuity: grossly normal with lens, performs annual eye exam  ADLs: capable Fall risk: none Home safety: good Cognitive evaluation: intact to orientation, naming, recall and repetition EOL planning: adv directives discussed, in place  I have personally reviewed and have noted 1. The patient's medical and social history - reviewed today no changes 2. Their use of alcohol, tobacco or illicit drugs 3. Their current medications and supplements 4. The patient's functional ability including ADL's, fall risks, home safety risks and hearing or visual impairment. 5. Diet and physical activities 6. Evidence for depression or mood disorders 7. Care team reviewed and updated (available in snapshot)  Review of Systems  Constitutional: Negative for fever, activity change, appetite change and fatigue.  HENT: Negative.   Eyes: Negative.   Respiratory: Negative for cough, chest tightness, shortness of breath and wheezing.   Cardiovascular: Negative for chest pain, palpitations and leg swelling.  Gastrointestinal: Negative for abdominal pain, diarrhea, constipation and abdominal distention.  Genitourinary: Negative.   Musculoskeletal: Negative for myalgias, arthralgias and gait problem.  Skin: Negative.   Neurological: Negative.   Psychiatric/Behavioral: Negative.       Objective:   Physical Exam  Constitutional: She is oriented to person, place, and time. She appears well-developed and well-nourished.  HENT:  Head: Normocephalic and atraumatic.  Right Ear: External ear normal.  Left Ear: External ear  normal.  Eyes: EOM are normal.  Neck: Normal range of motion.  Cardiovascular: Normal rate and regular rhythm.   Pulmonary/Chest: Effort normal and breath sounds normal. No respiratory distress. She has no wheezes. She has no rales.  Abdominal: Soft.  Neurological: She is alert and oriented to person, place, and time.  Skin: Skin is warm and dry.  Psychiatric: She has a normal mood and affect.   Filed Vitals:   08/22/15 1100  BP: 132/68  Pulse: 83  Temp: 97.9 F (36.6 C)  TempSrc: Oral  Resp: 12  Height: 5' 6.5" (1.689 m)  Weight: 176 lb (79.833 kg)  SpO2: 97%      Assessment & Plan:  prevnar 13 given at visit.

## 2015-08-22 NOTE — Assessment & Plan Note (Signed)
Checking labs, given prevnar 13 today to complete pneumonia series. Immunizations up to date. She will work on exercising more once her mom is home. Counseled on weight bearing exercise for hx of osteopenia. Given 10 year screening recommendations at visit.

## 2015-08-22 NOTE — Patient Instructions (Signed)
We have given you the pneumonia booster shot today.   We will have you come back fasting for the blood work and the lab opens at 7:30 AM.   Keep up the good work with your health and once your mom is back at home work on getting back to exercise.   Health Maintenance, Female Adopting a healthy lifestyle and getting preventive care can go a long way to promote health and wellness. Talk with your health care provider about what schedule of regular examinations is right for you. This is a good chance for you to check in with your provider about disease prevention and staying healthy. In between checkups, there are plenty of things you can do on your own. Experts have done a lot of research about which lifestyle changes and preventive measures are most likely to keep you healthy. Ask your health care provider for more information. WEIGHT AND DIET  Eat a healthy diet  Be sure to include plenty of vegetables, fruits, low-fat dairy products, and lean protein.  Do not eat a lot of foods high in solid fats, added sugars, or salt.  Get regular exercise. This is one of the most important things you can do for your health.  Most adults should exercise for at least 150 minutes each week. The exercise should increase your heart rate and make you sweat (moderate-intensity exercise).  Most adults should also do strengthening exercises at least twice a week. This is in addition to the moderate-intensity exercise.  Maintain a healthy weight  Body mass index (BMI) is a measurement that can be used to identify possible weight problems. It estimates body fat based on height and weight. Your health care provider can help determine your BMI and help you achieve or maintain a healthy weight.  For females 59 years of age and older:   A BMI below 18.5 is considered underweight.  A BMI of 18.5 to 24.9 is normal.  A BMI of 25 to 29.9 is considered overweight.  A BMI of 30 and above is considered obese.   Watch levels of cholesterol and blood lipids  You should start having your blood tested for lipids and cholesterol at 70 years of age, then have this test every 5 years.  You may need to have your cholesterol levels checked more often if:  Your lipid or cholesterol levels are high.  You are older than 70 years of age.  You are at high risk for heart disease.  CANCER SCREENING   Lung Cancer  Lung cancer screening is recommended for adults 18-88 years old who are at high risk for lung cancer because of a history of smoking.  A yearly low-dose CT scan of the lungs is recommended for people who:  Currently smoke.  Have quit within the past 15 years.  Have at least a 30-pack-year history of smoking. A pack year is smoking an average of one pack of cigarettes a day for 1 year.  Yearly screening should continue until it has been 15 years since you quit.  Yearly screening should stop if you develop a health problem that would prevent you from having lung cancer treatment.  Breast Cancer  Practice breast self-awareness. This means understanding how your breasts normally appear and feel.  It also means doing regular breast self-exams. Let your health care provider know about any changes, no matter how small.  If you are in your 20s or 30s, you should have a clinical breast exam (CBE) by a health  care provider every 1-3 years as part of a regular health exam.  If you are 34 or older, have a CBE every year. Also consider having a breast X-ray (mammogram) every year.  If you have a family history of breast cancer, talk to your health care provider about genetic screening.  If you are at high risk for breast cancer, talk to your health care provider about having an MRI and a mammogram every year.  Breast cancer gene (BRCA) assessment is recommended for women who have family members with BRCA-related cancers. BRCA-related cancers  include:  Breast.  Ovarian.  Tubal.  Peritoneal cancers.  Results of the assessment will determine the need for genetic counseling and BRCA1 and BRCA2 testing. Cervical Cancer Your health care provider may recommend that you be screened regularly for cancer of the pelvic organs (ovaries, uterus, and vagina). This screening involves a pelvic examination, including checking for microscopic changes to the surface of your cervix (Pap test). You may be encouraged to have this screening done every 3 years, beginning at age 21.  For women ages 89-65, health care providers may recommend pelvic exams and Pap testing every 3 years, or they may recommend the Pap and pelvic exam, combined with testing for human papilloma virus (HPV), every 5 years. Some types of HPV increase your risk of cervical cancer. Testing for HPV may also be done on women of any age with unclear Pap test results.  Other health care providers may not recommend any screening for nonpregnant women who are considered low risk for pelvic cancer and who do not have symptoms. Ask your health care provider if a screening pelvic exam is right for you.  If you have had past treatment for cervical cancer or a condition that could lead to cancer, you need Pap tests and screening for cancer for at least 20 years after your treatment. If Pap tests have been discontinued, your risk factors (such as having a new sexual partner) need to be reassessed to determine if screening should resume. Some women have medical problems that increase the chance of getting cervical cancer. In these cases, your health care provider may recommend more frequent screening and Pap tests. Colorectal Cancer  This type of cancer can be detected and often prevented.  Routine colorectal cancer screening usually begins at 70 years of age and continues through 70 years of age.  Your health care provider may recommend screening at an earlier age if you have risk factors for  colon cancer.  Your health care provider may also recommend using home test kits to check for hidden blood in the stool.  A small camera at the end of a tube can be used to examine your colon directly (sigmoidoscopy or colonoscopy). This is done to check for the earliest forms of colorectal cancer.  Routine screening usually begins at age 36.  Direct examination of the colon should be repeated every 5-10 years through 70 years of age. However, you may need to be screened more often if early forms of precancerous polyps or small growths are found. Skin Cancer  Check your skin from head to toe regularly.  Tell your health care provider about any new moles or changes in moles, especially if there is a change in a mole's shape or color.  Also tell your health care provider if you have a mole that is larger than the size of a pencil eraser.  Always use sunscreen. Apply sunscreen liberally and repeatedly throughout the day.  Protect  yourself by wearing long sleeves, pants, a wide-brimmed hat, and sunglasses whenever you are outside. HEART DISEASE, DIABETES, AND HIGH BLOOD PRESSURE   High blood pressure causes heart disease and increases the risk of stroke. High blood pressure is more likely to develop in:  People who have blood pressure in the high end of the normal range (130-139/85-89 mm Hg).  People who are overweight or obese.  People who are African American.  If you are 54-65 years of age, have your blood pressure checked every 3-5 years. If you are 81 years of age or older, have your blood pressure checked every year. You should have your blood pressure measured twice--once when you are at a hospital or clinic, and once when you are not at a hospital or clinic. Record the average of the two measurements. To check your blood pressure when you are not at a hospital or clinic, you can use:  An automated blood pressure machine at a pharmacy.  A home blood pressure monitor.  If you  are between 81 years and 18 years old, ask your health care provider if you should take aspirin to prevent strokes.  Have regular diabetes screenings. This involves taking a blood sample to check your fasting blood sugar level.  If you are at a normal weight and have a low risk for diabetes, have this test once every three years after 69 years of age.  If you are overweight and have a high risk for diabetes, consider being tested at a younger age or more often. PREVENTING INFECTION  Hepatitis B  If you have a higher risk for hepatitis B, you should be screened for this virus. You are considered at high risk for hepatitis B if:  You were born in a country where hepatitis B is common. Ask your health care provider which countries are considered high risk.  Your parents were born in a high-risk country, and you have not been immunized against hepatitis B (hepatitis B vaccine).  You have HIV or AIDS.  You use needles to inject street drugs.  You live with someone who has hepatitis B.  You have had sex with someone who has hepatitis B.  You get hemodialysis treatment.  You take certain medicines for conditions, including cancer, organ transplantation, and autoimmune conditions. Hepatitis C  Blood testing is recommended for:  Everyone born from [birth year] through 1966.  Anyone with known risk factors for hepatitis C. Sexually transmitted infections (STIs)  You should be screened for sexually transmitted infections (STIs) including gonorrhea and chlamydia if:  You are sexually active and are younger than 70 years of age.  You are older than 70 years of age and your health care provider tells you that you are at risk for this type of infection.  Your sexual activity has changed since you were last screened and you are at an increased risk for chlamydia or gonorrhea. Ask your health care provider if you are at risk.  If you do not have HIV, but are at risk, it may be recommended that you  take a prescription medicine daily to prevent HIV infection. This is called pre-exposure prophylaxis (PrEP). You are considered at risk if:  You are sexually active and do not regularly use condoms or know the HIV status of your partner(s).  You take drugs by injection.  You are sexually active with a partner who has HIV. Talk with your health care provider about whether you are at high risk of being infected with  HIV. If you choose to begin PrEP, you should first be tested for HIV. You should then be tested every 3 months for as long as you are taking PrEP.  PREGNANCY   If you are premenopausal and you may become pregnant, ask your health care provider about preconception counseling.  If you may become pregnant, take 400 to 800 micrograms (mcg) of folic acid every day.  If you want to prevent pregnancy, talk to your health care provider about birth control (contraception). OSTEOPOROSIS AND MENOPAUSE   Osteoporosis is a disease in which the bones lose minerals and strength with aging. This can result in serious bone fractures. Your risk for osteoporosis can be identified using a bone density scan.  If you are 53 years of age or older, or if you are at risk for osteoporosis and fractures, ask your health care provider if you should be screened.  Ask your health care provider whether you should take a calcium or vitamin D supplement to lower your risk for osteoporosis.  Menopause may have certain physical symptoms and risks.  Hormone replacement therapy may reduce some of these symptoms and risks. Talk to your health care provider about whether hormone replacement therapy is right for you.  HOME CARE INSTRUCTIONS   Schedule regular health, dental, and eye exams.  Stay current with your immunizations.   Do not use any tobacco products including cigarettes, chewing tobacco, or electronic cigarettes.  If you are pregnant, do not drink alcohol.  If you are breastfeeding, limit how  much and how often you drink alcohol.  Limit alcohol intake to no more than 1 drink per day for nonpregnant women. One drink equals 12 ounces of beer, 5 ounces of wine, or 1 ounces of hard liquor.  Do not use street drugs.  Do not share needles.  Ask your health care provider for help if you need support or information about quitting drugs.  Tell your health care provider if you often feel depressed.  Tell your health care provider if you have ever been abused or do not feel safe at home.   This information is not intended to replace advice given to you by your health care provider. Make sure you discuss any questions you have with your health care provider.   Document Released: 03/12/2011 Document Revised: 09/17/2014 Document Reviewed: 07/29/2013 Elsevier Interactive Patient Education [employer].

## 2015-08-22 NOTE — Progress Notes (Signed)
Pre visit review using our clinic review tool, if applicable. No additional management support is needed unless otherwise documented below in the visit note. 

## 2015-08-25 ENCOUNTER — Other Ambulatory Visit (INDEPENDENT_AMBULATORY_CARE_PROVIDER_SITE_OTHER): Payer: Medicare Other

## 2015-08-25 DIAGNOSIS — Z0000 Encounter for general adult medical examination without abnormal findings: Secondary | ICD-10-CM

## 2015-08-25 DIAGNOSIS — E785 Hyperlipidemia, unspecified: Secondary | ICD-10-CM | POA: Diagnosis not present

## 2015-08-25 LAB — LIPID PANEL
CHOLESTEROL: 226 mg/dL — AB (ref 0–200)
HDL: 61.1 mg/dL (ref >39.00)
LDL Cholesterol: 125 mg/dL — ABNORMAL HIGH (ref 0–99)
NonHDL: 164.71
TRIGLYCERIDES: 197 mg/dL — AB (ref 0.0–149.0)
Total CHOL/HDL Ratio: 4
VLDL: 39.4 mg/dL (ref 0.0–40.0)

## 2015-08-25 LAB — URINALYSIS, ROUTINE W REFLEX MICROSCOPIC
Bilirubin Urine: NEGATIVE
Ketones, ur: NEGATIVE
NITRITE: NEGATIVE
PH: 6.5 (ref 5.0–8.0)
SPECIFIC GRAVITY, URINE: 1.025 (ref 1.000–1.030)
Total Protein, Urine: NEGATIVE
URINE GLUCOSE: NEGATIVE
Urobilinogen, UA: 0.2 (ref 0.0–1.0)

## 2015-08-25 LAB — COMPREHENSIVE METABOLIC PANEL
ALBUMIN: 4.1 g/dL (ref 3.5–5.2)
ALK PHOS: 82 U/L (ref 39–117)
ALT: 14 U/L (ref 0–35)
AST: 17 U/L (ref 0–37)
BILIRUBIN TOTAL: 1.4 mg/dL — AB (ref 0.2–1.2)
BUN: 19 mg/dL (ref 6–23)
CALCIUM: 9.6 mg/dL (ref 8.4–10.5)
CO2: 31 mEq/L (ref 19–32)
CREATININE: 0.77 mg/dL (ref 0.40–1.20)
Chloride: 102 mEq/L (ref 96–112)
GFR: 78.69 mL/min (ref >60.00)
Glucose, Bld: 92 mg/dL (ref 70–99)
Potassium: 4.5 mEq/L (ref 3.5–5.1)
SODIUM: 138 meq/L (ref 135–145)
TOTAL PROTEIN: 7 g/dL (ref 6.0–8.3)

## 2015-08-26 LAB — HEPATITIS C ANTIBODY: HCV Ab: NEGATIVE

## 2016-03-14 ENCOUNTER — Other Ambulatory Visit: Payer: Self-pay | Admitting: Internal Medicine

## 2016-06-23 DIAGNOSIS — Z23 Encounter for immunization: Secondary | ICD-10-CM | POA: Diagnosis not present

## 2016-07-16 ENCOUNTER — Other Ambulatory Visit: Payer: Self-pay | Admitting: Obstetrics and Gynecology

## 2016-07-16 DIAGNOSIS — Z1231 Encounter for screening mammogram for malignant neoplasm of breast: Secondary | ICD-10-CM

## 2016-08-21 ENCOUNTER — Ambulatory Visit
Admission: RE | Admit: 2016-08-21 | Discharge: 2016-08-21 | Disposition: A | Discharge status: Home / Self Care | Payer: Medicare Other | Source: Ambulatory Visit | Attending: Obstetrics and Gynecology | Admitting: Obstetrics and Gynecology

## 2016-08-21 DIAGNOSIS — Z1231 Encounter for screening mammogram for malignant neoplasm of breast: Secondary | ICD-10-CM

## 2016-08-28 ENCOUNTER — Encounter: Payer: Self-pay | Admitting: Internal Medicine

## 2016-08-28 ENCOUNTER — Ambulatory Visit (INDEPENDENT_AMBULATORY_CARE_PROVIDER_SITE_OTHER): Payer: Medicare Other | Admitting: Internal Medicine

## 2016-08-28 VITALS — BP 140/60 | HR 69 | Temp 98.2°F | Resp 14 | Ht 67.0 in | Wt 172.0 lb

## 2016-08-28 DIAGNOSIS — R1310 Dysphagia, unspecified: Secondary | ICD-10-CM

## 2016-08-28 DIAGNOSIS — E785 Hyperlipidemia, unspecified: Secondary | ICD-10-CM

## 2016-08-28 DIAGNOSIS — Z0000 Encounter for general adult medical examination without abnormal findings: Secondary | ICD-10-CM

## 2016-08-28 NOTE — Progress Notes (Signed)
   Subjective:    Patient ID: Olivia Mendoza, female    DOB: [birth year], 70 y.o.   MRN: [id number]  HPI Here for medicare wellness, no new complaints. Please see A/P for status and treatment of chronic medical problems.   HPI #2: The patient is a 70 YO female coming in for follow up of her cholesterol (still taking her simvastatin 20 mg daily, no side effects, no new symptoms of CAD or stroke) and her ibs (taking otc magnesium) and her dysphagia (better with zantac daily, still symptoms with certain foods which she avoids, has also changed her diet some).   Diet: heart healthy Physical activity: active Depression/mood screen: negative Hearing: intact to whispered voice Visual acuity: grossly normal, performs annual eye exam  ADLs: capable Fall risk: none Home safety: good Cognitive evaluation: intact to orientation, naming, recall and repetition EOL planning: adv directives discussed  I have personally reviewed and have noted 1. The patient's medical and social history - reviewed today no changes 2. Their use of alcohol, tobacco or illicit drugs 3. Their current medications and supplements 4. The patient's functional ability including ADL's, fall risks, home safety risks and hearing or visual impairment. 5. Diet and physical activities 6. Evidence for depression or mood disorders 7. Care team reviewed and updated (available in snapshot)  Review of Systems  Constitutional: Negative.   HENT: Negative.   Eyes: Negative.   Respiratory: Negative for cough, chest tightness and shortness of breath.   Cardiovascular: Negative for chest pain, palpitations and leg swelling.  Gastrointestinal: Negative for abdominal distention, abdominal pain, constipation, diarrhea, nausea and vomiting.  Musculoskeletal: Negative.   Skin: Negative.   Neurological: Negative.   Psychiatric/Behavioral: Negative.       Objective:   Physical Exam  Constitutional: She is oriented to person, place, and time.  She appears well-developed and well-nourished.  HENT:  Head: Normocephalic and atraumatic.  Eyes: EOM are normal.  Neck: Normal range of motion.  Cardiovascular: Normal rate and regular rhythm.   Pulmonary/Chest: Effort normal and breath sounds normal. No respiratory distress. She has no wheezes. She has no rales.  Abdominal: Soft. Bowel sounds are normal. She exhibits no distension. There is no tenderness. There is no rebound.  Musculoskeletal: She exhibits no edema.  Neurological: She is alert and oriented to person, place, and time. Coordination normal.  Skin: Skin is warm and dry.  Psychiatric: She has a normal mood and affect.   Vitals:   08/28/16 1305  BP: 140/60  Pulse: 69  Resp: 14  Temp: 98.2 F (36.8 C)  TempSrc: Oral  SpO2: 98%  Weight: 172 lb (78 kg)  Height: 5\' 7"  (1.702 m)      Assessment & Plan:

## 2016-08-28 NOTE — Progress Notes (Signed)
Pre visit review using our clinic review tool, if applicable. No additional management support is needed unless otherwise documented below in the visit note. 

## 2016-08-28 NOTE — Assessment & Plan Note (Signed)
Taking zantac and doing well, has also adjusted diet.

## 2016-08-28 NOTE — Patient Instructions (Signed)
We will have you come back for the labs fasting some morning. The lab opens at 7:30 am.   Health Maintenance, Female Introduction Adopting a healthy lifestyle and getting preventive care can go a long way to promote health and wellness. Talk with your health care provider about what schedule of regular examinations is right for you. This is a good chance for you to check in with your provider about disease prevention and staying healthy. In between checkups, there are plenty of things you can do on your own. Experts have done a lot of research about which lifestyle changes and preventive measures are most likely to keep you healthy. Ask your health care provider for more information. Weight and diet Eat a healthy diet  Be sure to include plenty of vegetables, fruits, low-fat dairy products, and lean protein.  Do not eat a lot of foods high in solid fats, added sugars, or salt.  Get regular exercise. This is one of the most important things you can do for your health.  Most adults should exercise for at least 150 minutes each week. The exercise should increase your heart rate and make you sweat (moderate-intensity exercise).  Most adults should also do strengthening exercises at least twice a week. This is in addition to the moderate-intensity exercise. Maintain a healthy weight  Body mass index (BMI) is a measurement that can be used to identify possible weight problems. It estimates body fat based on height and weight. Your health care provider can help determine your BMI and help you achieve or maintain a healthy weight.  For females 67 years of age and older:  A BMI below 18.5 is considered underweight.  A BMI of 18.5 to 24.9 is normal.  A BMI of 25 to 29.9 is considered overweight.  A BMI of 30 and above is considered obese. Watch levels of cholesterol and blood lipids  You should start having your blood tested for lipids and cholesterol at 71 years of age, then have this test  every 5 years.  You may need to have your cholesterol levels checked more often if:  Your lipid or cholesterol levels are high.  You are older than 71 years of age.  You are at high risk for heart disease. Cancer screening Lung Cancer  Lung cancer screening is recommended for adults 79-51 years old who are at high risk for lung cancer because of a history of smoking.  A yearly low-dose CT scan of the lungs is recommended for people who:  Currently smoke.  Have quit within the past 15 years.  Have at least a 30-pack-year history of smoking. A pack year is smoking an average of one pack of cigarettes a day for 1 year.  Yearly screening should continue until it has been 15 years since you quit.  Yearly screening should stop if you develop a health problem that would prevent you from having lung cancer treatment. Breast Cancer  Practice breast self-awareness. This means understanding how your breasts normally appear and feel.  It also means doing regular breast self-exams. Let your health care provider know about any changes, no matter how small.  If you are in your 20s or 30s, you should have a clinical breast exam (CBE) by a health care provider every 1-3 years as part of a regular health exam.  If you are 89 or older, have a CBE every year. Also consider having a breast X-ray (mammogram) every year.  If you have a family history of breast  cancer, talk to your health care provider about genetic screening.  If you are at high risk for breast cancer, talk to your health care provider about having an MRI and a mammogram every year.  Breast cancer gene (BRCA) assessment is recommended for women who have family members with BRCA-related cancers. BRCA-related cancers include:  Breast.  Ovarian.  Tubal.  Peritoneal cancers.  Results of the assessment will determine the need for genetic counseling and BRCA1 and BRCA2 testing. Cervical Cancer  Your health care provider may  recommend that you be screened regularly for cancer of the pelvic organs (ovaries, uterus, and vagina). This screening involves a pelvic examination, including checking for microscopic changes to the surface of your cervix (Pap test). You may be encouraged to have this screening done every 3 years, beginning at age 45.  For women ages 48-65, health care providers may recommend pelvic exams and Pap testing every 3 years, or they may recommend the Pap and pelvic exam, combined with testing for human papilloma virus (HPV), every 5 years. Some types of HPV increase your risk of cervical cancer. Testing for HPV may also be done on women of any age with unclear Pap test results.  Other health care providers may not recommend any screening for nonpregnant women who are considered low risk for pelvic cancer and who do not have symptoms. Ask your health care provider if a screening pelvic exam is right for you.  If you have had past treatment for cervical cancer or a condition that could lead to cancer, you need Pap tests and screening for cancer for at least 20 years after your treatment. If Pap tests have been discontinued, your risk factors (such as having a new sexual partner) need to be reassessed to determine if screening should resume. Some women have medical problems that increase the chance of getting cervical cancer. In these cases, your health care provider may recommend more frequent screening and Pap tests. Colorectal Cancer  This type of cancer can be detected and often prevented.  Routine colorectal cancer screening usually begins at 71 years of age and continues through 71 years of age.  Your health care provider may recommend screening at an earlier age if you have risk factors for colon cancer.  Your health care provider may also recommend using home test kits to check for hidden blood in the stool.  A small camera at the end of a tube can be used to examine your colon directly  (sigmoidoscopy or colonoscopy). This is done to check for the earliest forms of colorectal cancer.  Routine screening usually begins at age 83.  Direct examination of the colon should be repeated every 5-10 years through 71 years of age. However, you may need to be screened more often if early forms of precancerous polyps or small growths are found. Skin Cancer  Check your skin from head to toe regularly.  Tell your health care provider about any new moles or changes in moles, especially if there is a change in a mole's shape or color.  Also tell your health care provider if you have a mole that is larger than the size of a pencil eraser.  Always use sunscreen. Apply sunscreen liberally and repeatedly throughout the day.  Protect yourself by wearing long sleeves, pants, a wide-brimmed hat, and sunglasses whenever you are outside. Heart disease, diabetes, and high blood pressure  High blood pressure causes heart disease and increases the risk of stroke. High blood pressure is more likely  to develop in:  People who have blood pressure in the high end of the normal range (130-139/85-89 mm Hg).  People who are overweight or obese.  People who are African American.  If you are 39-70 years of age, have your blood pressure checked every 3-5 years. If you are 65 years of age or older, have your blood pressure checked every year. You should have your blood pressure measured twice-once when you are at a hospital or clinic, and once when you are not at a hospital or clinic. Record the average of the two measurements. To check your blood pressure when you are not at a hospital or clinic, you can use:  An automated blood pressure machine at a pharmacy.  A home blood pressure monitor.  If you are between 44 years and 40 years old, ask your health care provider if you should take aspirin to prevent strokes.  Have regular diabetes screenings. This involves taking a blood sample to check your  fasting blood sugar level.  If you are at a normal weight and have a low risk for diabetes, have this test once every three years after 71 years of age.  If you are overweight and have a high risk for diabetes, consider being tested at a younger age or more often. Preventing infection Hepatitis B  If you have a higher risk for hepatitis B, you should be screened for this virus. You are considered at high risk for hepatitis B if:  You were born in a country where hepatitis B is common. Ask your health care provider which countries are considered high risk.  Your parents were born in a high-risk country, and you have not been immunized against hepatitis B (hepatitis B vaccine).  You have HIV or AIDS.  You use needles to inject street drugs.  You live with someone who has hepatitis B.  You have had sex with someone who has hepatitis B.  You get hemodialysis treatment.  You take certain medicines for conditions, including cancer, organ transplantation, and autoimmune conditions. Hepatitis C  Blood testing is recommended for:  Everyone born from [birth year] through 1966.  Anyone with known risk factors for hepatitis C. Sexually transmitted infections (STIs)  You should be screened for sexually transmitted infections (STIs) including gonorrhea and chlamydia if:  You are sexually active and are younger than 71 years of age.  You are older than 71 years of age and your health care provider tells you that you are at risk for this type of infection.  Your sexual activity has changed since you were last screened and you are at an increased risk for chlamydia or gonorrhea. Ask your health care provider if you are at risk.  If you do not have HIV, but are at risk, it may be recommended that you take a prescription medicine daily to prevent HIV infection. This is called pre-exposure prophylaxis (PrEP). You are considered at risk if:  You are sexually active and do not regularly use condoms or  know the HIV status of your partner(s).  You take drugs by injection.  You are sexually active with a partner who has HIV. Talk with your health care provider about whether you are at high risk of being infected with HIV. If you choose to begin PrEP, you should first be tested for HIV. You should then be tested every 3 months for as long as you are taking PrEP. Pregnancy  If you are premenopausal and you may become pregnant, ask your  health care provider about preconception counseling.  If you may become pregnant, take 400 to 800 micrograms (mcg) of folic acid every day.  If you want to prevent pregnancy, talk to your health care provider about birth control (contraception). Osteoporosis and menopause  Osteoporosis is a disease in which the bones lose minerals and strength with aging. This can result in serious bone fractures. Your risk for osteoporosis can be identified using a bone density scan.  If you are 29 years of age or older, or if you are at risk for osteoporosis and fractures, ask your health care provider if you should be screened.  Ask your health care provider whether you should take a calcium or vitamin D supplement to lower your risk for osteoporosis.  Menopause may have certain physical symptoms and risks.  Hormone replacement therapy may reduce some of these symptoms and risks. Talk to your health care provider about whether hormone replacement therapy is right for you. Follow these instructions at home:  Schedule regular health, dental, and eye exams.  Stay current with your immunizations.  Do not use any tobacco products including cigarettes, chewing tobacco, or electronic cigarettes.  If you are pregnant, do not drink alcohol.  If you are breastfeeding, limit how much and how often you drink alcohol.  Limit alcohol intake to no more than 1 drink per day for nonpregnant women. One drink equals 12 ounces of beer, 5 ounces of wine, or 1 ounces of hard  liquor.  Do not use street drugs.  Do not share needles.  Ask your health care provider for help if you need support or information about quitting drugs.  Tell your health care provider if you often feel depressed.  Tell your health care provider if you have ever been abused or do not feel safe at home. This information is not intended to replace advice given to you by your health care provider. Make sure you discuss any questions you have with your health care provider. Document Released: 03/12/2011 Document Revised: 02/02/2016 Document Reviewed: 05/31/2015  2017 [employer]

## 2016-08-28 NOTE — Assessment & Plan Note (Signed)
Taking simvastatin 20 mg daily without side effects. Checking lipid panel and adjust for LDL <130.

## 2016-08-28 NOTE — Assessment & Plan Note (Signed)
Checking labs, flu shot up to date. Tetanus and pneumonia and shingles are up to date. Colonoscopy up to date. Mammogram discussed with patient and normal. Counseled on sun safety and mole surveillance. Given 10 year screening recommendations.

## 2016-09-11 DIAGNOSIS — H2513 Age-related nuclear cataract, bilateral: Secondary | ICD-10-CM | POA: Diagnosis not present

## 2016-09-11 DIAGNOSIS — Z0100 Encounter for examination of eyes and vision without abnormal findings: Secondary | ICD-10-CM | POA: Diagnosis not present

## 2016-09-25 ENCOUNTER — Other Ambulatory Visit: Payer: Self-pay | Admitting: Internal Medicine

## 2016-09-25 ENCOUNTER — Other Ambulatory Visit (INDEPENDENT_AMBULATORY_CARE_PROVIDER_SITE_OTHER): Payer: Medicare Other

## 2016-09-25 DIAGNOSIS — E785 Hyperlipidemia, unspecified: Secondary | ICD-10-CM

## 2016-09-25 LAB — COMPREHENSIVE METABOLIC PANEL
ALT: 13 U/L (ref 0–35)
AST: 16 U/L (ref 0–37)
Albumin: 4.2 g/dL (ref 3.5–5.2)
Alkaline Phosphatase: 70 U/L (ref 39–117)
BUN: 18 mg/dL (ref 6–23)
CHLORIDE: 101 meq/L (ref 96–112)
CO2: 30 mEq/L (ref 19–32)
Calcium: 9.6 mg/dL (ref 8.4–10.5)
Creatinine, Ser: 0.8 mg/dL (ref 0.40–1.20)
GFR: 75.06 mL/min (ref >60.00)
GLUCOSE: 93 mg/dL (ref 70–99)
POTASSIUM: 4.3 meq/L (ref 3.5–5.1)
SODIUM: 137 meq/L (ref 135–145)
Total Bilirubin: 1.1 mg/dL (ref 0.2–1.2)
Total Protein: 7.1 g/dL (ref 6.0–8.3)

## 2016-09-25 LAB — LIPID PANEL
Cholesterol: 247 mg/dL — ABNORMAL HIGH (ref 0–200)
HDL: 56.5 mg/dL (ref >39.00)
NONHDL: 190.26
Total CHOL/HDL Ratio: 4
Triglycerides: 328 mg/dL — ABNORMAL HIGH (ref 0.0–149.0)
VLDL: 65.6 mg/dL — AB (ref 0.0–40.0)

## 2016-09-25 LAB — LDL CHOLESTEROL, DIRECT: Direct LDL: 132 mg/dL

## 2016-10-09 DIAGNOSIS — Z01419 Encounter for gynecological examination (general) (routine) without abnormal findings: Secondary | ICD-10-CM | POA: Diagnosis not present

## 2016-10-09 DIAGNOSIS — Z6828 Body mass index (BMI) 28.0-28.9, adult: Secondary | ICD-10-CM | POA: Diagnosis not present

## 2017-03-19 ENCOUNTER — Telehealth: Payer: Self-pay | Admitting: Internal Medicine

## 2017-03-19 MED ORDER — SIMVASTATIN 20 MG PO TABS: ORAL_TABLET | ORAL | 1 refills | Status: DC

## 2017-03-19 NOTE — Telephone Encounter (Signed)
Pt called stating that she is needing a refill on simvastatin (ZOCOR) 20 MG tablet. She is down to 1 pill left. She said that [facility] on [address]  [place] has been requesting the refill.

## 2017-03-19 NOTE — Telephone Encounter (Signed)
sent 

## 2017-06-28 DIAGNOSIS — Z23 Encounter for immunization: Secondary | ICD-10-CM | POA: Diagnosis not present

## 2017-09-12 ENCOUNTER — Other Ambulatory Visit: Payer: Self-pay | Admitting: Family

## 2017-09-12 ENCOUNTER — Telehealth: Payer: Self-pay | Admitting: Internal Medicine

## 2017-09-12 MED ORDER — SIMVASTATIN 20 MG PO TABS: ORAL_TABLET | ORAL | 0 refills | Status: DC

## 2017-09-12 NOTE — Telephone Encounter (Signed)
Has upcoming CPE 09/19/17 Last refill 03/19/17 90# 1 RF Refilled x 1 month

## 2017-09-12 NOTE — Telephone Encounter (Signed)
Copied from [place] [id number]. Topic: Quick Communication - See Telephone Encounter >> Sep 12, 2017 10:04 AM [employer] wrote: CRM for notification. See Telephone encounter for:  Pt is needing a refill on simvastatin and when she requested it by pharmacy she was told needed an appt which she now has one on 09/19/17 for her complete pe 09/12/17.

## 2017-09-19 ENCOUNTER — Ambulatory Visit (INDEPENDENT_AMBULATORY_CARE_PROVIDER_SITE_OTHER): Payer: Medicare Other | Admitting: Internal Medicine

## 2017-09-19 ENCOUNTER — Other Ambulatory Visit (INDEPENDENT_AMBULATORY_CARE_PROVIDER_SITE_OTHER): Payer: Medicare Other

## 2017-09-19 ENCOUNTER — Encounter: Payer: Self-pay | Admitting: Internal Medicine

## 2017-09-19 VITALS — BP 138/78 | HR 80 | Temp 98.0°F | Ht 67.0 in | Wt 182.0 lb

## 2017-09-19 DIAGNOSIS — E785 Hyperlipidemia, unspecified: Secondary | ICD-10-CM

## 2017-09-19 DIAGNOSIS — Z0000 Encounter for general adult medical examination without abnormal findings: Secondary | ICD-10-CM | POA: Diagnosis not present

## 2017-09-19 DIAGNOSIS — R1310 Dysphagia, unspecified: Secondary | ICD-10-CM | POA: Diagnosis not present

## 2017-09-19 LAB — CBC
HCT: 42.2 % (ref 36.0–46.0)
Hemoglobin: 13.8 g/dL (ref 12.0–15.0)
MCHC: 32.6 g/dL (ref 30.0–36.0)
MCV: 85.4 fl (ref 78.0–100.0)
PLATELETS: 380 10*3/uL (ref 150.0–400.0)
RBC: 4.94 Mil/uL (ref 3.87–5.11)
RDW: 14 % (ref 11.5–15.5)
WBC: 6.3 10*3/uL (ref 4.0–10.5)

## 2017-09-19 LAB — COMPREHENSIVE METABOLIC PANEL
ALBUMIN: 4.2 g/dL (ref 3.5–5.2)
ALT: 12 U/L (ref 0–35)
AST: 15 U/L (ref 0–37)
Alkaline Phosphatase: 83 U/L (ref 39–117)
BILIRUBIN TOTAL: 1 mg/dL (ref 0.2–1.2)
BUN: 16 mg/dL (ref 6–23)
CALCIUM: 9.5 mg/dL (ref 8.4–10.5)
CO2: 29 meq/L (ref 19–32)
CREATININE: 0.76 mg/dL (ref 0.40–1.20)
Chloride: 99 mEq/L (ref 96–112)
GFR: 79.41 mL/min (ref >60.00)
Glucose, Bld: 95 mg/dL (ref 70–99)
Potassium: 5.2 mEq/L — ABNORMAL HIGH (ref 3.5–5.1)
Sodium: 139 mEq/L (ref 135–145)
Total Protein: 7.2 g/dL (ref 6.0–8.3)

## 2017-09-19 LAB — LIPID PANEL
Cholesterol: 214 mg/dL — ABNORMAL HIGH (ref 0–200)
HDL: 51.6 mg/dL (ref >39.00)
Total CHOL/HDL Ratio: 4
Triglycerides: 403 mg/dL — ABNORMAL HIGH (ref 0.0–149.0)

## 2017-09-19 LAB — LDL CHOLESTEROL, DIRECT: Direct LDL: 124 mg/dL

## 2017-09-19 MED ORDER — BENZONATATE 200 MG PO CAPS: 200.0000 mg | ORAL_CAPSULE | Freq: Two times a day (BID) | ORAL | 3 refills | Status: DC | PRN

## 2017-09-19 MED ORDER — FLUTICASONE PROPIONATE 50 MCG/ACT NA SUSP: 2.0000 | Freq: Every day | NASAL | 11 refills | Status: DC

## 2017-09-19 MED ORDER — ZOSTER VAC RECOMB ADJUVANTED 50 MCG/0.5ML IM SUSR: 0.5000 mL | Freq: Once | INTRAMUSCULAR | 1 refills | Status: AC

## 2017-09-19 NOTE — Patient Instructions (Addendum)
We will check the labs today and have sent in the shingles vaccine to your pharmacy.   Health Maintenance, Female Adopting a healthy lifestyle and getting preventive care can go a long way to promote health and wellness. Talk with your health care provider about what schedule of regular examinations is right for you. This is a good chance for you to check in with your provider about disease prevention and staying healthy. In between checkups, there are plenty of things you can do on your own. Experts have done a lot of research about which lifestyle changes and preventive measures are most likely to keep you healthy. Ask your health care provider for more information. Weight and diet Eat a healthy diet  Be sure to include plenty of vegetables, fruits, low-fat dairy products, and lean protein.  Do not eat a lot of foods high in solid fats, added sugars, or salt.  Get regular exercise. This is one of the most important things you can do for your health. ? Most adults should exercise for at least 150 minutes each week. The exercise should increase your heart rate and make you sweat (moderate-intensity exercise). ? Most adults should also do strengthening exercises at least twice a week. This is in addition to the moderate-intensity exercise.  Maintain a healthy weight  Body mass index (BMI) is a measurement that can be used to identify possible weight problems. It estimates body fat based on height and weight. Your health care provider can help determine your BMI and help you achieve or maintain a healthy weight.  For females 44 years of age and older: ? A BMI below 18.5 is considered underweight. ? A BMI of 18.5 to 24.9 is normal. ? A BMI of 25 to 29.9 is considered overweight. ? A BMI of 30 and above is considered obese.  Watch levels of cholesterol and blood lipids  You should start having your blood tested for lipids and cholesterol at 73 years of age, then have this test every 5  years.  You may need to have your cholesterol levels checked more often if: ? Your lipid or cholesterol levels are high. ? You are older than 73 years of age. ? You are at high risk for heart disease.  Cancer screening Lung Cancer  Lung cancer screening is recommended for adults 65-48 years old who are at high risk for lung cancer because of a history of smoking.  A yearly low-dose CT scan of the lungs is recommended for people who: ? Currently smoke. ? Have quit within the past 15 years. ? Have at least a 30-pack-year history of smoking. A pack year is smoking an average of one pack of cigarettes a day for 1 year.  Yearly screening should continue until it has been 15 years since you quit.  Yearly screening should stop if you develop a health problem that would prevent you from having lung cancer treatment.  Breast Cancer  Practice breast self-awareness. This means understanding how your breasts normally appear and feel.  It also means doing regular breast self-exams. Let your health care provider know about any changes, no matter how small.  If you are in your 20s or 30s, you should have a clinical breast exam (CBE) by a health care provider every 1-3 years as part of a regular health exam.  If you are 54 or older, have a CBE every year. Also consider having a breast X-ray (mammogram) every year.  If you have a family history of  breast cancer, talk to your health care provider about genetic screening.  If you are at high risk for breast cancer, talk to your health care provider about having an MRI and a mammogram every year.  Breast cancer gene (BRCA) assessment is recommended for women who have family members with BRCA-related cancers. BRCA-related cancers include: ? Breast. ? Ovarian. ? Tubal. ? Peritoneal cancers.  Results of the assessment will determine the need for genetic counseling and BRCA1 and BRCA2 testing.  Cervical Cancer Your health care provider may  recommend that you be screened regularly for cancer of the pelvic organs (ovaries, uterus, and vagina). This screening involves a pelvic examination, including checking for microscopic changes to the surface of your cervix (Pap test). You may be encouraged to have this screening done every 3 years, beginning at age 21.  For women ages 30-65, health care providers may recommend pelvic exams and Pap testing every 3 years, or they may recommend the Pap and pelvic exam, combined with testing for human papilloma virus (HPV), every 5 years. Some types of HPV increase your risk of cervical cancer. Testing for HPV may also be done on women of any age with unclear Pap test results.  Other health care providers may not recommend any screening for nonpregnant women who are considered low risk for pelvic cancer and who do not have symptoms. Ask your health care provider if a screening pelvic exam is right for you.  If you have had past treatment for cervical cancer or a condition that could lead to cancer, you need Pap tests and screening for cancer for at least 20 years after your treatment. If Pap tests have been discontinued, your risk factors (such as having a new sexual partner) need to be reassessed to determine if screening should resume. Some women have medical problems that increase the chance of getting cervical cancer. In these cases, your health care provider may recommend more frequent screening and Pap tests.  Colorectal Cancer  This type of cancer can be detected and often prevented.  Routine colorectal cancer screening usually begins at 73 years of age and continues through 73 years of age.  Your health care provider may recommend screening at an earlier age if you have risk factors for colon cancer.  Your health care provider may also recommend using home test kits to check for hidden blood in the stool.  A small camera at the end of a tube can be used to examine your colon directly  (sigmoidoscopy or colonoscopy). This is done to check for the earliest forms of colorectal cancer.  Routine screening usually begins at age 50.  Direct examination of the colon should be repeated every 5-10 years through 73 years of age. However, you may need to be screened more often if early forms of precancerous polyps or small growths are found.  Skin Cancer  Check your skin from head to toe regularly.  Tell your health care provider about any new moles or changes in moles, especially if there is a change in a mole's shape or color.  Also tell your health care provider if you have a mole that is larger than the size of a pencil eraser.  Always use sunscreen. Apply sunscreen liberally and repeatedly throughout the day.  Protect yourself by wearing long sleeves, pants, a wide-brimmed hat, and sunglasses whenever you are outside.  Heart disease, diabetes, and high blood pressure  High blood pressure causes heart disease and increases the risk of stroke. High blood   pressure is more likely to develop in: ? People who have blood pressure in the high end of the normal range (130-139/85-89 mm Hg). ? People who are overweight or obese. ? People who are African American.  If you are 60-67 years of age, have your blood pressure checked every 3-5 years. If you are 95 years of age or older, have your blood pressure checked every year. You should have your blood pressure measured twice-once when you are at a hospital or clinic, and once when you are not at a hospital or clinic. Record the average of the two measurements. To check your blood pressure when you are not at a hospital or clinic, you can use: ? An automated blood pressure machine at a pharmacy. ? A home blood pressure monitor.  If you are between 59 years and 32 years old, ask your health care provider if you should take aspirin to prevent strokes.  Have regular diabetes screenings. This involves taking a blood sample to check your  fasting blood sugar level. ? If you are at a normal weight and have a low risk for diabetes, have this test once every three years after 73 years of age. ? If you are overweight and have a high risk for diabetes, consider being tested at a younger age or more often. Preventing infection Hepatitis B  If you have a higher risk for hepatitis B, you should be screened for this virus. You are considered at high risk for hepatitis B if: ? You were born in a country where hepatitis B is common. Ask your health care provider which countries are considered high risk. ? Your parents were born in a high-risk country, and you have not been immunized against hepatitis B (hepatitis B vaccine). ? You have HIV or AIDS. ? You use needles to inject street drugs. ? You live with someone who has hepatitis B. ? You have had sex with someone who has hepatitis B. ? You get hemodialysis treatment. ? You take certain medicines for conditions, including cancer, organ transplantation, and autoimmune conditions.  Hepatitis C  Blood testing is recommended for: ? Everyone born from [birth year] through 1966. ? Anyone with known risk factors for hepatitis C.  Sexually transmitted infections (STIs)  You should be screened for sexually transmitted infections (STIs) including gonorrhea and chlamydia if: ? You are sexually active and are younger than 73 years of age. ? You are older than 73 years of age and your health care provider tells you that you are at risk for this type of infection. ? Your sexual activity has changed since you were last screened and you are at an increased risk for chlamydia or gonorrhea. Ask your health care provider if you are at risk.  If you do not have HIV, but are at risk, it may be recommended that you take a prescription medicine daily to prevent HIV infection. This is called pre-exposure prophylaxis (PrEP). You are considered at risk if: ? You are sexually active and do not regularly use condoms  or know the HIV status of your partner(s). ? You take drugs by injection. ? You are sexually active with a partner who has HIV.  Talk with your health care provider about whether you are at high risk of being infected with HIV. If you choose to begin PrEP, you should first be tested for HIV. You should then be tested every 3 months for as long as you are taking PrEP. Pregnancy  If you are premenopausal  and you may become pregnant, ask your health care provider about preconception counseling.  If you may become pregnant, take 400 to 800 micrograms (mcg) of folic acid every day.  If you want to prevent pregnancy, talk to your health care provider about birth control (contraception). Osteoporosis and menopause  Osteoporosis is a disease in which the bones lose minerals and strength with aging. This can result in serious bone fractures. Your risk for osteoporosis can be identified using a bone density scan.  If you are 83 years of age or older, or if you are at risk for osteoporosis and fractures, ask your health care provider if you should be screened.  Ask your health care provider whether you should take a calcium or vitamin D supplement to lower your risk for osteoporosis.  Menopause may have certain physical symptoms and risks.  Hormone replacement therapy may reduce some of these symptoms and risks. Talk to your health care provider about whether hormone replacement therapy is right for you. Follow these instructions at home:  Schedule regular health, dental, and eye exams.  Stay current with your immunizations.  Do not use any tobacco products including cigarettes, chewing tobacco, or electronic cigarettes.  If you are pregnant, do not drink alcohol.  If you are breastfeeding, limit how much and how often you drink alcohol.  Limit alcohol intake to no more than 1 drink per day for nonpregnant women. One drink equals 12 ounces of beer, 5 ounces of wine, or 1 ounces of hard  liquor.  Do not use street drugs.  Do not share needles.  Ask your health care provider for help if you need support or information about quitting drugs.  Tell your health care provider if you often feel depressed.  Tell your health care provider if you have ever been abused or do not feel safe at home. This information is not intended to replace advice given to you by your health care provider. Make sure you discuss any questions you have with your health care provider. Document Released: 03/12/2011 Document Revised: 02/02/2016 Document Reviewed: 05/31/2015 Elsevier Interactive Patient Education  [employer].

## 2017-09-19 NOTE — Progress Notes (Signed)
   Subjective:    Patient ID: Olivia Mendoza, female    DOB: [birth year], 72 y.o.   MRN: [id number]  HPI Here for medicare wellness, no new complaints. Please see A/P for status and treatment of chronic medical problems.   HPI #2: Here for follow up of her cholesterol (taking simvastatin daily, denies headaches or chest pains or muscle aches) and her allergies (taking flonase some, has not had for some time, worse in the last month or so).   Diet: heart healthy Physical activity: sedentary Depression/mood screen: negative Hearing: intact to whispered voice Visual acuity: grossly normal with contacts, performs annual eye exam  ADLs: capable Fall risk: none Home safety: good Cognitive evaluation: intact to orientation, naming, recall and repetition EOL planning: adv directives discussed  I have personally reviewed and have noted 1. The patient's medical and social history - reviewed today no changes 2. Their use of alcohol, tobacco or illicit drugs 3. Their current medications and supplements 4. The patient's functional ability including ADL's, fall risks, home safety risks and hearing or visual impairment. 5. Diet and physical activities 6. Evidence for depression or mood disorders 7. Care team reviewed and updated (available in snapshot  Review of Systems  Constitutional: Negative.   HENT: Positive for congestion.   Eyes: Negative.   Respiratory: Negative for cough, chest tightness and shortness of breath.   Cardiovascular: Negative for chest pain, palpitations and leg swelling.  Gastrointestinal: Negative for abdominal distention, abdominal pain, constipation, diarrhea, nausea and vomiting.  Musculoskeletal: Negative.   Skin: Negative.   Neurological: Negative.   Psychiatric/Behavioral: Negative.       Objective:   Physical Exam  Constitutional: She is oriented to person, place, and time. She appears well-developed and well-nourished.  HENT:  Head: Normocephalic and  atraumatic.  Eyes: EOM are normal.  Neck: Normal range of motion.  Cardiovascular: Normal rate and regular rhythm.  Pulmonary/Chest: Effort normal and breath sounds normal. No respiratory distress. She has no wheezes. She has no rales.  Abdominal: Soft. Bowel sounds are normal. She exhibits no distension. There is no tenderness. There is no rebound.  Musculoskeletal: She exhibits no edema.  Neurological: She is alert and oriented to person, place, and time. Coordination normal.  Skin: Skin is warm and dry.  Psychiatric: She has a normal mood and affect.   Vitals:   09/19/17 1337  BP: 138/78  Pulse: 80  Temp: 98 F (36.7 C)  TempSrc: Oral  SpO2: 98%  Weight: 182 lb (82.6 kg)  Height: 5\' 7"  (1.702 m)      Assessment & Plan:

## 2017-09-20 NOTE — Assessment & Plan Note (Signed)
Stable with diet changes and avoidance of certain foods.

## 2017-09-20 NOTE — Assessment & Plan Note (Signed)
Checking lipid panel and adjust as needed. Taking simvastatin 20 mg daily.  

## 2017-09-20 NOTE — Assessment & Plan Note (Signed)
Colonoscopy up to date, mammogram up to date. DEXA up to date. Pneumonia and flu and tetanus up to date. Counseled about shingrix. Counseled about sun safety and mole surveillance. Given 10 year screening recommendations.

## 2017-09-26 ENCOUNTER — Other Ambulatory Visit: Payer: Self-pay | Admitting: Obstetrics and Gynecology

## 2017-09-26 DIAGNOSIS — Z1231 Encounter for screening mammogram for malignant neoplasm of breast: Secondary | ICD-10-CM

## 2017-10-11 ENCOUNTER — Other Ambulatory Visit: Payer: Self-pay | Admitting: Internal Medicine

## 2017-10-17 ENCOUNTER — Ambulatory Visit: Payer: Medicare Other

## 2017-12-09 ENCOUNTER — Encounter: Payer: Self-pay | Admitting: Gastroenterology

## 2017-12-17 DIAGNOSIS — Z6828 Body mass index (BMI) 28.0-28.9, adult: Secondary | ICD-10-CM | POA: Diagnosis not present

## 2017-12-17 DIAGNOSIS — Z01419 Encounter for gynecological examination (general) (routine) without abnormal findings: Secondary | ICD-10-CM | POA: Diagnosis not present

## 2017-12-24 ENCOUNTER — Ambulatory Visit
Admission: RE | Admit: 2017-12-24 | Discharge: 2017-12-24 | Disposition: A | Discharge status: Home / Self Care | Payer: Medicare Other | Source: Ambulatory Visit | Attending: Obstetrics and Gynecology | Admitting: Obstetrics and Gynecology

## 2017-12-24 DIAGNOSIS — Z1231 Encounter for screening mammogram for malignant neoplasm of breast: Secondary | ICD-10-CM | POA: Diagnosis not present

## 2017-12-26 ENCOUNTER — Other Ambulatory Visit: Payer: Self-pay | Admitting: Obstetrics and Gynecology

## 2017-12-26 DIAGNOSIS — R928 Other abnormal and inconclusive findings on diagnostic imaging of breast: Secondary | ICD-10-CM

## 2017-12-27 ENCOUNTER — Ambulatory Visit: Payer: Medicare Other

## 2017-12-27 ENCOUNTER — Ambulatory Visit
Admission: RE | Admit: 2017-12-27 | Discharge: 2017-12-27 | Disposition: A | Discharge status: Home / Self Care | Payer: Medicare Other | Source: Ambulatory Visit | Attending: Obstetrics and Gynecology | Admitting: Obstetrics and Gynecology

## 2017-12-27 DIAGNOSIS — R921 Mammographic calcification found on diagnostic imaging of breast: Secondary | ICD-10-CM | POA: Diagnosis not present

## 2017-12-27 DIAGNOSIS — R928 Other abnormal and inconclusive findings on diagnostic imaging of breast: Secondary | ICD-10-CM

## 2018-06-11 DIAGNOSIS — Z23 Encounter for immunization: Secondary | ICD-10-CM | POA: Diagnosis not present

## 2018-09-23 ENCOUNTER — Encounter: Payer: Self-pay | Admitting: Internal Medicine

## 2018-09-23 ENCOUNTER — Other Ambulatory Visit (INDEPENDENT_AMBULATORY_CARE_PROVIDER_SITE_OTHER): Payer: Medicare Other

## 2018-09-23 ENCOUNTER — Ambulatory Visit (INDEPENDENT_AMBULATORY_CARE_PROVIDER_SITE_OTHER): Payer: Medicare Other | Admitting: Internal Medicine

## 2018-09-23 VITALS — BP 130/82 | HR 80 | Temp 98.4°F | Ht 67.0 in | Wt 193.0 lb

## 2018-09-23 DIAGNOSIS — J3089 Other allergic rhinitis: Secondary | ICD-10-CM

## 2018-09-23 DIAGNOSIS — Z0000 Encounter for general adult medical examination without abnormal findings: Secondary | ICD-10-CM

## 2018-09-23 DIAGNOSIS — E785 Hyperlipidemia, unspecified: Secondary | ICD-10-CM

## 2018-09-23 DIAGNOSIS — Z13220 Encounter for screening for lipoid disorders: Secondary | ICD-10-CM | POA: Diagnosis not present

## 2018-09-23 DIAGNOSIS — K589 Irritable bowel syndrome without diarrhea: Secondary | ICD-10-CM

## 2018-09-23 DIAGNOSIS — J309 Allergic rhinitis, unspecified: Secondary | ICD-10-CM | POA: Insufficient documentation

## 2018-09-23 LAB — LIPID PANEL
CHOL/HDL RATIO: 4
CHOLESTEROL: 234 mg/dL — AB (ref 0–200)
HDL: 57.8 mg/dL (ref >39.00)
Triglycerides: 418 mg/dL — ABNORMAL HIGH (ref 0.0–149.0)

## 2018-09-23 LAB — CBC
HCT: 42.3 % (ref 36.0–46.0)
HEMOGLOBIN: 14.3 g/dL (ref 12.0–15.0)
MCHC: 33.7 g/dL (ref 30.0–36.0)
MCV: 83.5 fl (ref 78.0–100.0)
Platelets: 354 10*3/uL (ref 150.0–400.0)
RBC: 5.07 Mil/uL (ref 3.87–5.11)
RDW: 13.8 % (ref 11.5–15.5)
WBC: 6.3 10*3/uL (ref 4.0–10.5)

## 2018-09-23 LAB — COMPREHENSIVE METABOLIC PANEL
ALT: 20 U/L (ref 0–35)
AST: 20 U/L (ref 0–37)
Albumin: 4.2 g/dL (ref 3.5–5.2)
Alkaline Phosphatase: 91 U/L (ref 39–117)
BUN: 15 mg/dL (ref 6–23)
CO2: 29 meq/L (ref 19–32)
Calcium: 10.3 mg/dL (ref 8.4–10.5)
Chloride: 99 mEq/L (ref 96–112)
Creatinine, Ser: 0.86 mg/dL (ref 0.40–1.20)
GFR: 68.66 mL/min (ref >60.00)
GLUCOSE: 85 mg/dL (ref 70–99)
POTASSIUM: 4.6 meq/L (ref 3.5–5.1)
Sodium: 136 mEq/L (ref 135–145)
Total Bilirubin: 0.9 mg/dL (ref 0.2–1.2)
Total Protein: 7.3 g/dL (ref 6.0–8.3)

## 2018-09-23 LAB — LDL CHOLESTEROL, DIRECT: Direct LDL: 133 mg/dL

## 2018-09-23 MED ORDER — BENZONATATE 200 MG PO CAPS: 200.0000 mg | ORAL_CAPSULE | Freq: Two times a day (BID) | ORAL | 3 refills | Status: DC | PRN

## 2018-09-23 MED ORDER — MECLIZINE HCL 12.5 MG PO TABS: 12.5000 mg | ORAL_TABLET | Freq: Three times a day (TID) | ORAL | 3 refills | Status: DC | PRN

## 2018-09-23 MED ORDER — FLUTICASONE PROPIONATE 50 MCG/ACT NA SUSP: 2.0000 | Freq: Every day | NASAL | 11 refills | Status: DC

## 2018-09-23 NOTE — Progress Notes (Signed)
   Subjective:   Patient ID: Olivia Mendoza, female    DOB: 06-Aug-1946, 73 y.o.   MRN: [id number]  HPI Here for medicare wellness, no new complaints. Please see A/P for status and treatment of chronic medical problems.   HPI #2: Here for follow up allergic rhinitis (taking claritin daily which has helped a lot, uses flonase also as needed, denies current flare of symptoms), and her hyperlipidemia (taking simvastatin daily, denies muscle aches, denies chest pains or stroke symptoms), and her IBS (under good control currently, does have some constipation currently, stopped taking zantac due to recall and has been taking a lot of rolaids).   Diet: heart healthy Physical activity: sedentary Depression/mood screen: negative Hearing: intact to whispered voice Visual acuity: grossly normal, performs annual eye exam  ADLs: capable Fall risk: none Home safety: good Cognitive evaluation: intact to orientation, naming, recall and repetition EOL planning: adv directives discussed    Office Visit from 09/23/2018 in [place]  PHQ-2 Total Score  1       I have personally reviewed and have noted 1. The patient's medical and social history - reviewed today no changes 2. Their use of alcohol, tobacco or illicit drugs 3. Their current medications and supplements 4. The patient's functional ability including ADL's, fall risks, home safety risks and hearing or visual impairment. 5. Diet and physical activities 6. Evidence for depression or mood disorders 7. Care team reviewed and updated (available in snapshot)  Review of Systems  Constitutional: Negative.   HENT: Negative.   Eyes: Negative.   Respiratory: Negative for cough, chest tightness and shortness of breath.   Cardiovascular: Negative for chest pain, palpitations and leg swelling.  Gastrointestinal: Positive for constipation. Negative for abdominal distention, abdominal pain, diarrhea, nausea and vomiting.    Musculoskeletal: Negative.   Skin: Negative.   Neurological: Negative.   Psychiatric/Behavioral: Negative.     Objective:  Physical Exam Constitutional:      Appearance: She is well-developed.  HENT:     Head: Normocephalic and atraumatic.  Neck:     Musculoskeletal: Normal range of motion.  Cardiovascular:     Rate and Rhythm: Normal rate and regular rhythm.  Pulmonary:     Effort: Pulmonary effort is normal. No respiratory distress.     Breath sounds: Normal breath sounds. No wheezing or rales.  Abdominal:     General: Bowel sounds are normal. There is no distension.     Palpations: Abdomen is soft.     Tenderness: There is no abdominal tenderness. There is no rebound.  Skin:    General: Skin is warm and dry.  Neurological:     Mental Status: She is alert and oriented to person, place, and time.     Coordination: Coordination normal.     Vitals:   09/23/18 1445  BP: 130/82  Pulse: 80  Temp: 98.4 F (36.9 C)  TempSrc: Oral  SpO2: 97%  Weight: 193 lb (87.5 kg)  Height: 5\' 7"  (1.702 m)    Assessment & Plan:

## 2018-09-23 NOTE — Assessment & Plan Note (Signed)
Checking lipid panel and adjust simvastatin 20 mg daily as needed.  

## 2018-09-23 NOTE — Assessment & Plan Note (Signed)
Will have her switch to pepcid and stop rolaids to avoid constipation.

## 2018-09-23 NOTE — Patient Instructions (Addendum)
Pepcid (famotidine) is the option for replacing the zantac.   Health Maintenance, Female Adopting a healthy lifestyle and getting preventive care can go a long way to promote health and wellness. Talk with your health care provider about what schedule of regular examinations is right for you. This is a good chance for you to check in with your provider about disease prevention and staying healthy. In between checkups, there are plenty of things you can do on your own. Experts have done a lot of research about which lifestyle changes and preventive measures are most likely to keep you healthy. Ask your health care provider for more information. Weight and diet Eat a healthy diet  Be sure to include plenty of vegetables, fruits, low-fat dairy products, and lean protein.  Do not eat a lot of foods high in solid fats, added sugars, or salt.  Get regular exercise. This is one of the most important things you can do for your health. ? Most adults should exercise for at least 150 minutes each week. The exercise should increase your heart rate and make you sweat (moderate-intensity exercise). ? Most adults should also do strengthening exercises at least twice a week. This is in addition to the moderate-intensity exercise. Maintain a healthy weight  Body mass index (BMI) is a measurement that can be used to identify possible weight problems. It estimates body fat based on height and weight. Your health care provider can help determine your BMI and help you achieve or maintain a healthy weight.  For females 74 years of age and older: ? A BMI below 18.5 is considered underweight. ? A BMI of 18.5 to 24.9 is normal. ? A BMI of 25 to 29.9 is considered overweight. ? A BMI of 30 and above is considered obese. Watch levels of cholesterol and blood lipids  You should start having your blood tested for lipids and cholesterol at 74 years of age, then have this test every 5 years.  You may need to have your  cholesterol levels checked more often if: ? Your lipid or cholesterol levels are high. ? You are older than 74 years of age. ? You are at high risk for heart disease. Cancer screening Lung Cancer  Lung cancer screening is recommended for adults 52-21 years old who are at high risk for lung cancer because of a history of smoking.  A yearly low-dose CT scan of the lungs is recommended for people who: ? Currently smoke. ? Have quit within the past 15 years. ? Have at least a 30-pack-year history of smoking. A pack year is smoking an average of one pack of cigarettes a day for 1 year.  Yearly screening should continue until it has been 15 years since you quit.  Yearly screening should stop if you develop a health problem that would prevent you from having lung cancer treatment. Breast Cancer  Practice breast self-awareness. This means understanding how your breasts normally appear and feel.  It also means doing regular breast self-exams. Let your health care provider know about any changes, no matter how small.  If you are in your 20s or 30s, you should have a clinical breast exam (CBE) by a health care provider every 1-3 years as part of a regular health exam.  If you are 7 or older, have a CBE every year. Also consider having a breast X-ray (mammogram) every year.  If you have a family history of breast cancer, talk to your health care provider about genetic screening.  If you are at high risk for breast cancer, talk to your health care provider about having an MRI and a mammogram every year.  Breast cancer gene (BRCA) assessment is recommended for women who have family members with BRCA-related cancers. BRCA-related cancers include: ? Breast. ? Ovarian. ? Tubal. ? Peritoneal cancers.  Results of the assessment will determine the need for genetic counseling and BRCA1 and BRCA2 testing. Cervical Cancer Your health care provider may recommend that you be screened regularly for  cancer of the pelvic organs (ovaries, uterus, and vagina). This screening involves a pelvic examination, including checking for microscopic changes to the surface of your cervix (Pap test). You may be encouraged to have this screening done every 3 years, beginning at age 42.  For women ages 58-65, health care providers may recommend pelvic exams and Pap testing every 3 years, or they may recommend the Pap and pelvic exam, combined with testing for human papilloma virus (HPV), every 5 years. Some types of HPV increase your risk of cervical cancer. Testing for HPV may also be done on women of any age with unclear Pap test results.  Other health care providers may not recommend any screening for nonpregnant women who are considered low risk for pelvic cancer and who do not have symptoms. Ask your health care provider if a screening pelvic exam is right for you.  If you have had past treatment for cervical cancer or a condition that could lead to cancer, you need Pap tests and screening for cancer for at least 20 years after your treatment. If Pap tests have been discontinued, your risk factors (such as having a new sexual partner) need to be reassessed to determine if screening should resume. Some women have medical problems that increase the chance of getting cervical cancer. In these cases, your health care provider may recommend more frequent screening and Pap tests. Colorectal Cancer  This type of cancer can be detected and often prevented.  Routine colorectal cancer screening usually begins at 74 years of age and continues through 74 years of age.  Your health care provider may recommend screening at an earlier age if you have risk factors for colon cancer.  Your health care provider may also recommend using home test kits to check for hidden blood in the stool.  A small camera at the end of a tube can be used to examine your colon directly (sigmoidoscopy or colonoscopy). This is done to check for  the earliest forms of colorectal cancer.  Routine screening usually begins at age 11.  Direct examination of the colon should be repeated every 5-10 years through 74 years of age. However, you may need to be screened more often if early forms of precancerous polyps or small growths are found. Skin Cancer  Check your skin from head to toe regularly.  Tell your health care provider about any new moles or changes in moles, especially if there is a change in a mole's shape or color.  Also tell your health care provider if you have a mole that is larger than the size of a pencil eraser.  Always use sunscreen. Apply sunscreen liberally and repeatedly throughout the day.  Protect yourself by wearing long sleeves, pants, a wide-brimmed hat, and sunglasses whenever you are outside. Heart disease, diabetes, and high blood pressure  High blood pressure causes heart disease and increases the risk of stroke. High blood pressure is more likely to develop in: ? People who have blood pressure in the high  end of the normal range (130-139/85-89 mm Hg). ? People who are overweight or obese. ? People who are African American.  If you are 36-85 years of age, have your blood pressure checked every 3-5 years. If you are 30 years of age or older, have your blood pressure checked every year. You should have your blood pressure measured twice-once when you are at a hospital or clinic, and once when you are not at a hospital or clinic. Record the average of the two measurements. To check your blood pressure when you are not at a hospital or clinic, you can use: ? An automated blood pressure machine at a pharmacy. ? A home blood pressure monitor.  If you are between 67 years and 52 years old, ask your health care provider if you should take aspirin to prevent strokes.  Have regular diabetes screenings. This involves taking a blood sample to check your fasting blood sugar level. ? If you are at a normal weight and  have a low risk for diabetes, have this test once every three years after 74 years of age. ? If you are overweight and have a high risk for diabetes, consider being tested at a younger age or more often. Preventing infection Hepatitis B  If you have a higher risk for hepatitis B, you should be screened for this virus. You are considered at high risk for hepatitis B if: ? You were born in a country where hepatitis B is common. Ask your health care provider which countries are considered high risk. ? Your parents were born in a high-risk country, and you have not been immunized against hepatitis B (hepatitis B vaccine). ? You have HIV or AIDS. ? You use needles to inject street drugs. ? You live with someone who has hepatitis B. ? You have had sex with someone who has hepatitis B. ? You get hemodialysis treatment. ? You take certain medicines for conditions, including cancer, organ transplantation, and autoimmune conditions. Hepatitis C  Blood testing is recommended for: ? Everyone born from [birth year] through 1966. ? Anyone with known risk factors for hepatitis C. Sexually transmitted infections (STIs)  You should be screened for sexually transmitted infections (STIs) including gonorrhea and chlamydia if: ? You are sexually active and are younger than 74 years of age. ? You are older than 74 years of age and your health care provider tells you that you are at risk for this type of infection. ? Your sexual activity has changed since you were last screened and you are at an increased risk for chlamydia or gonorrhea. Ask your health care provider if you are at risk.  If you do not have HIV, but are at risk, it may be recommended that you take a prescription medicine daily to prevent HIV infection. This is called pre-exposure prophylaxis (PrEP). You are considered at risk if: ? You are sexually active and do not regularly use condoms or know the HIV status of your partner(s). ? You take drugs by  injection. ? You are sexually active with a partner who has HIV. Talk with your health care provider about whether you are at high risk of being infected with HIV. If you choose to begin PrEP, you should first be tested for HIV. You should then be tested every 3 months for as long as you are taking PrEP. Pregnancy  If you are premenopausal and you may become pregnant, ask your health care provider about preconception counseling.  If you may become pregnant,  take 400 to 800 micrograms (mcg) of folic acid every day.  If you want to prevent pregnancy, talk to your health care provider about birth control (contraception). Osteoporosis and menopause  Osteoporosis is a disease in which the bones lose minerals and strength with aging. This can result in serious bone fractures. Your risk for osteoporosis can be identified using a bone density scan.  If you are 30 years of age or older, or if you are at risk for osteoporosis and fractures, ask your health care provider if you should be screened.  Ask your health care provider whether you should take a calcium or vitamin D supplement to lower your risk for osteoporosis.  Menopause may have certain physical symptoms and risks.  Hormone replacement therapy may reduce some of these symptoms and risks. Talk to your health care provider about whether hormone replacement therapy is right for you. Follow these instructions at home:  Schedule regular health, dental, and eye exams.  Stay current with your immunizations.  Do not use any tobacco products including cigarettes, chewing tobacco, or electronic cigarettes.  If you are pregnant, do not drink alcohol.  If you are breastfeeding, limit how much and how often you drink alcohol.  Limit alcohol intake to no more than 1 drink per day for nonpregnant women. One drink equals 12 ounces of beer, 5 ounces of wine, or 1 ounces of hard liquor.  Do not use street drugs.  Do not share needles.  Ask  your health care provider for help if you need support or information about quitting drugs.  Tell your health care provider if you often feel depressed.  Tell your health care provider if you have ever been abused or do not feel safe at home. This information is not intended to replace advice given to you by your health care provider. Make sure you discuss any questions you have with your health care provider. Document Released: 03/12/2011 Document Revised: 02/02/2016 Document Reviewed: 05/31/2015 Elsevier Interactive Patient Education  2019 [employer].

## 2018-09-23 NOTE — Assessment & Plan Note (Signed)
Taking claritin and flonase. Refilled flonase and tessalon perles.

## 2018-09-23 NOTE — Assessment & Plan Note (Signed)
Flu shot up to date. Pneumonia complete. Shingrix counseled. Tetanus up to date. Colonoscopy needed. Mammogram up to date, pap smear aged out and dexa up to date. Counseled about sun safety and mole surveillance. Counseled about the dangers of distracted driving. Given 10 year screening recommendations.

## 2018-09-28 ENCOUNTER — Other Ambulatory Visit: Payer: Self-pay | Admitting: Internal Medicine

## 2019-06-16 DIAGNOSIS — Z23 Encounter for immunization: Secondary | ICD-10-CM | POA: Diagnosis not present

## 2019-09-23 ENCOUNTER — Other Ambulatory Visit: Payer: Self-pay | Admitting: Internal Medicine

## 2019-09-30 ENCOUNTER — Encounter: Payer: Self-pay | Admitting: Internal Medicine

## 2019-09-30 ENCOUNTER — Ambulatory Visit (INDEPENDENT_AMBULATORY_CARE_PROVIDER_SITE_OTHER): Payer: Medicare Other | Admitting: Internal Medicine

## 2019-09-30 ENCOUNTER — Other Ambulatory Visit: Payer: Self-pay

## 2019-09-30 VITALS — BP 126/82 | HR 83 | Temp 98.0°F | Ht 67.0 in | Wt 197.0 lb

## 2019-09-30 DIAGNOSIS — Z0000 Encounter for general adult medical examination without abnormal findings: Secondary | ICD-10-CM | POA: Diagnosis not present

## 2019-09-30 DIAGNOSIS — E785 Hyperlipidemia, unspecified: Secondary | ICD-10-CM | POA: Diagnosis not present

## 2019-09-30 DIAGNOSIS — J3089 Other allergic rhinitis: Secondary | ICD-10-CM

## 2019-09-30 DIAGNOSIS — K589 Irritable bowel syndrome without diarrhea: Secondary | ICD-10-CM

## 2019-09-30 LAB — LIPID PANEL
Cholesterol: 228 mg/dL — ABNORMAL HIGH (ref 0–200)
HDL: 55.2 mg/dL (ref >39.00)
NonHDL: 173.05
Total CHOL/HDL Ratio: 4
Triglycerides: 264 mg/dL — ABNORMAL HIGH (ref 0.0–149.0)
VLDL: 52.8 mg/dL — ABNORMAL HIGH (ref 0.0–40.0)

## 2019-09-30 LAB — COMPREHENSIVE METABOLIC PANEL
ALT: 22 U/L (ref 0–35)
AST: 25 U/L (ref 0–37)
Albumin: 4.3 g/dL (ref 3.5–5.2)
Alkaline Phosphatase: 107 U/L (ref 39–117)
BUN: 17 mg/dL (ref 6–23)
CO2: 25 mEq/L (ref 19–32)
Calcium: 9.7 mg/dL (ref 8.4–10.5)
Chloride: 103 mEq/L (ref 96–112)
Creatinine, Ser: 0.8 mg/dL (ref 0.40–1.20)
GFR: 70.03 mL/min (ref >60.00)
Glucose, Bld: 104 mg/dL — ABNORMAL HIGH (ref 70–99)
Potassium: 4.1 mEq/L (ref 3.5–5.1)
Sodium: 137 mEq/L (ref 135–145)
Total Bilirubin: 1.3 mg/dL — ABNORMAL HIGH (ref 0.2–1.2)
Total Protein: 7.2 g/dL (ref 6.0–8.3)

## 2019-09-30 LAB — CBC
HCT: 42.7 % (ref 36.0–46.0)
Hemoglobin: 14.4 g/dL (ref 12.0–15.0)
MCHC: 33.7 g/dL (ref 30.0–36.0)
MCV: 87 fl (ref 78.0–100.0)
Platelets: 336 10*3/uL (ref 150.0–400.0)
RBC: 4.91 Mil/uL (ref 3.87–5.11)
RDW: 13.4 % (ref 11.5–15.5)
WBC: 4.4 10*3/uL (ref 4.0–10.5)

## 2019-09-30 LAB — LDL CHOLESTEROL, DIRECT: Direct LDL: 138 mg/dL

## 2019-09-30 MED ORDER — MECLIZINE HCL 12.5 MG PO TABS: 12.5000 mg | ORAL_TABLET | Freq: Three times a day (TID) | ORAL | 3 refills | Status: DC | PRN

## 2019-09-30 MED ORDER — FLUTICASONE PROPIONATE 50 MCG/ACT NA SUSP: 2.0000 | Freq: Every day | NASAL | 3 refills | Status: DC

## 2019-09-30 MED ORDER — SIMVASTATIN 20 MG PO TABS: 20.0000 mg | ORAL_TABLET | Freq: Every day | ORAL | 3 refills | Status: DC

## 2019-09-30 MED ORDER — BENZONATATE 200 MG PO CAPS: 200.0000 mg | ORAL_CAPSULE | Freq: Two times a day (BID) | ORAL | 3 refills | Status: DC | PRN

## 2019-09-30 NOTE — Progress Notes (Signed)
Subjective:   Patient ID: Olivia Mendoza, female    DOB: [birth year], 74 y.o.   MRN: [id number]  HPI Here for medicare wellness, no new complaints. Please see A/P for status and treatment of chronic medical problems.   HPI #2: Here for follow up cholesterol (taking simvastatin, denies side effects, denies chest pains or stroke symptoms), and allergic rhinitis (taking flonase and claritin, overall decent control, denies current symptoms outside ordinary including fevers or chills), and IBS (currently doing okay, taking otc medications daily to help control symptoms).   Diet: heart healthy Physical activity: sedentary Depression/mood screen: negative Hearing: intact to whispered voice Visual acuity: grossly normal, performs annual eye exam  ADLs: capable Fall risk: none Home safety: good Cognitive evaluation: intact to orientation, naming, recall and repetition EOL planning: adv directives discussed    Office Visit from 09/30/2019 in [place] at [facility] Total Score  0      I have personally reviewed and have noted 1. The patient's medical and social history - reviewed today no changes 2. Their use of alcohol, tobacco or illicit drugs 3. Their current medications and supplements 4. The patient's functional ability including ADL's, fall risks, home safety risks and hearing or visual impairment. 5. Diet and physical activities 6. Evidence for depression or mood disorders 7. Care team reviewed and updated  Patient Care Team: Hoyt Koch, MD as PCP - General (Internal Medicine) Lomax, Marny Lowenstein, MD (Inactive) (Obstetrics and Gynecology) Ralene Bathe, MD (Ophthalmology) Inda Castle, MD (Inactive) (Gastroenterology) Past Medical History:  Diagnosis Date  . Dysphagia   . Fibrocystic breast disease   . History of cystopexy   . History of migraines   . Hyperlipemia   . IBS (irritable bowel syndrome)   . Mononucleosis   . MVP (mitral valve  prolapse)   . Positional vertigo   . PVC (premature ventricular contraction)   . Routine general medical examination at a health care facility    Past Surgical History:  Procedure Laterality Date  . BIOPSY THYROID    . BREAST CYST ASPIRATION    . LAPAROSCOPIC TUBAL LIGATION     Bilateral  . VAGINAL HYSTERECTOMY  89   Family History  Problem Relation Age of Onset  . Hypertension Mother   . Hyperlipidemia Mother   . Stroke Mother   . Hiatal hernia Mother   . Arthritis Mother   . Heart disease Mother        PTVDP-ss syndrome (Dr Elisabeth Cara)  . Hemochromatosis Father   . Hyperlipidemia Father   . Hypertension Father   . Heart disease Father        CAD/CABG  . Heart disease Brother        AAA Stent, PVD s/p stent, CAD, lipids  . Breast cancer Maternal Grandmother 92   Review of Systems  Constitutional: Negative.   HENT: Negative.   Eyes: Negative.   Respiratory: Negative for cough, chest tightness and shortness of breath.   Cardiovascular: Negative for chest pain, palpitations and leg swelling.  Gastrointestinal: Negative for abdominal distention, abdominal pain, constipation, diarrhea, nausea and vomiting.  Musculoskeletal: Negative.   Skin: Negative.   Neurological: Negative.   Psychiatric/Behavioral: Negative.     Objective:  Physical Exam Constitutional:      Appearance: She is well-developed.  HENT:     Head: Normocephalic and atraumatic.  Cardiovascular:     Rate and Rhythm: Normal rate and regular rhythm.  Pulmonary:     Effort: Pulmonary  effort is normal. No respiratory distress.     Breath sounds: Normal breath sounds. No wheezing or rales.  Abdominal:     General: Bowel sounds are normal. There is no distension.     Palpations: Abdomen is soft.     Tenderness: There is no abdominal tenderness. There is no rebound.  Musculoskeletal:     Cervical back: Normal range of motion.  Skin:    General: Skin is warm and dry.  Neurological:     Mental Status: She  is alert and oriented to person, place, and time.     Coordination: Coordination normal.     Vitals:   09/30/19 1004  BP: 126/82  Pulse: 83  Temp: 98 F (36.7 C)  TempSrc: Oral  SpO2: 97%  Weight: 197 lb (89.4 kg)  Height: 5\' 7"  (1.702 m)   This visit occurred during the SARS-CoV-2 public health emergency.  Safety protocols were in place, including screening questions prior to the visit, additional usage of staff PPE, and extensive cleaning of exam room while observing appropriate contact time as indicated for disinfecting solutions.   Assessment & Plan:

## 2019-09-30 NOTE — Patient Instructions (Signed)
We are committed to keeping you informed about the COVID-19 vaccine.  As the vaccine continues to become available for each phase, we will ensure that patients who meet the criteria receive the information they need to access vaccination opportunities. Continue to check your MyChart account and [email or web address] for updates. Please review the Phase 1b information below.  Following [place]'s guidelines for the distribution of COVID-19 vaccines we are pleased to share our plans to begin offering vaccines to those 65 and older (Phase 1b). Here are details of those plans:  [place] On Tuesday, Jan. 19, the [place] [facility]) and [place] begin large-scale COVID-19 vaccinations at the [place]. The vaccinations are appointment only and for those 13 and older.  Walk-ins will not be accepted.  All appointments are currently filled. Please join our waiting list for the next available appointments. We will contact you when appointments become available. Please do not sign up more than once.  Join Our Waiting List   Other Vaccination Opportunities in [place] We are also working in partnership with county health agencies in our service counties to ensure continuing vaccination availability in the weeks and months ahead. Learn more about each county's vaccination efforts in the website links below:   [place]'s phase 1b vaccination guidelines, prioritizing those 65 and over as the next eligible group to receive the COVID-19 vaccine, are detailed at [email or web address].   Vaccine Safety and Effectiveness Clinical trials for the Pfizer COVID-19 vaccine involved 42,000 people and showed that the vaccine is more than 95% effective in preventing COVID-19 with no serious safety concerns. Similar results have  been reported for the Moderna COVID-19 vaccine. Side effects reported in the [place] clinical trials include a sore arm at the injection site, fatigue, headache, chills and fever. While side effects from the [place] COVID-19 vaccine are higher than for a typical flu vaccine, they are lower in many ways than side effects from the leading vaccine to prevent shingles. Side effects are signs that a vaccine is working and are related to your immune system being stimulated to produce antibodies against infection. Side effects from vaccination are far less significant than health impacts from COVID-19.  Staying Informed Pharmacists, infectious disease doctors, critical care nurses and other experts at [facility] continue to speak publicly through media interviews and direct communication with our patients and communities about the safety, effectiveness and importance of vaccines to eliminate COVID-19. In addition, reliable information on vaccine safety, effectiveness, side effects and more is available on the following websites:  [place] Department of Health and Human Services COVID-19 Vaccine Information Website.  U.S. Centers for Disease Control and Prevention XX123456 Human resources officer.  Staying Safe We agree with the CDC on what we can do to help our communities get back to normal: Getting "back to normal" is going to take all of our tools. If we use all the tools we have, we stand the best chance of getting our families, communities, schools and workplaces "back to normal" sooner:  Get vaccinated as soon as vaccines become available within the phase of the state's vaccination rollout plan for which you meet the eligibility criteria.  Wear a mask.  Stay 6 feet from others and avoid crowds.  Wash hands often.  For our most current information, please visit [email or web address].  Health Maintenance, Female Adopting a healthy lifestyle  and getting preventive care are important  in promoting health and wellness. Ask your health care provider about:  The right schedule for you to have regular tests and exams.  Things you can do on your own to prevent diseases and keep yourself healthy. What should I know about diet, weight, and exercise? Eat a healthy diet   Eat a diet that includes plenty of vegetables, fruits, low-fat dairy products, and lean protein.  Do not eat a lot of foods that are high in solid fats, added sugars, or sodium. Maintain a healthy weight Body mass index (BMI) is used to identify weight problems. It estimates body fat based on height and weight. Your health care provider can help determine your BMI and help you achieve or maintain a healthy weight. Get regular exercise Get regular exercise. This is one of the most important things you can do for your health. Most adults should:  Exercise for at least 150 minutes each week. The exercise should increase your heart rate and make you sweat (moderate-intensity exercise).  Do strengthening exercises at least twice a week. This is in addition to the moderate-intensity exercise.  Spend less time sitting. Even light physical activity can be beneficial. Watch cholesterol and blood lipids Have your blood tested for lipids and cholesterol at 75 years of age, then have this test every 5 years. Have your cholesterol levels checked more often if:  Your lipid or cholesterol levels are high.  You are older than 75 years of age.  You are at high risk for heart disease. What should I know about cancer screening? Depending on your health history and family history, you may need to have cancer screening at various ages. This may include screening for:  Breast cancer.  Cervical cancer.  Colorectal cancer.  Skin cancer.  Lung cancer. What should I know about heart disease, diabetes, and high blood pressure? Blood pressure and heart disease  High blood pressure causes heart disease and increases the  risk of stroke. This is more likely to develop in people who have high blood pressure readings, are of African descent, or are overweight.  Have your blood pressure checked: ? Every 3-5 years if you are 24-86 years of age. ? Every year if you are 76 years old or older. Diabetes Have regular diabetes screenings. This checks your fasting blood sugar level. Have the screening done:  Once every three years after age 24 if you are at a normal weight and have a low risk for diabetes.  More often and at a younger age if you are overweight or have a high risk for diabetes. What should I know about preventing infection? Hepatitis B If you have a higher risk for hepatitis B, you should be screened for this virus. Talk with your health care provider to find out if you are at risk for hepatitis B infection. Hepatitis C Testing is recommended for:  Everyone born from 1 through 1966.  Anyone with known risk factors for hepatitis C. Sexually transmitted infections (STIs)  Get screened for STIs, including gonorrhea and chlamydia, if: ? You are sexually active and are younger than 75 years of age. ? You are older than 75 years of age and your health care provider tells you that you are at risk for this type of infection. ? Your sexual activity has changed since you were last screened, and you are at increased risk for chlamydia or gonorrhea. Ask your health care provider if you are at risk.  Ask  your health care provider about whether you are at high risk for HIV. Your health care provider may recommend a prescription medicine to help prevent HIV infection. If you choose to take medicine to prevent HIV, you should first get tested for HIV. You should then be tested every 3 months for as long as you are taking the medicine. Pregnancy  If you are about to stop having your period (premenopausal) and you may become pregnant, seek counseling before you get pregnant.  Take 400 to 800 micrograms (mcg) of  folic acid every day if you become pregnant.  Ask for birth control (contraception) if you want to prevent pregnancy. Osteoporosis and menopause Osteoporosis is a disease in which the bones lose minerals and strength with aging. This can result in bone fractures. If you are 47 years old or older, or if you are at risk for osteoporosis and fractures, ask your health care provider if you should:  Be screened for bone loss.  Take a calcium or vitamin D supplement to lower your risk of fractures.  Be given hormone replacement therapy (HRT) to treat symptoms of menopause. Follow these instructions at home: Lifestyle  Do not use any products that contain nicotine or tobacco, such as cigarettes, e-cigarettes, and chewing tobacco. If you need help quitting, ask your health care provider.  Do not use street drugs.  Do not share needles.  Ask your health care provider for help if you need support or information about quitting drugs. Alcohol use  Do not drink alcohol if: ? Your health care provider tells you not to drink. ? You are pregnant, may be pregnant, or are planning to become pregnant.  If you drink alcohol: ? Limit how much you use to 0-1 drink a day. ? Limit intake if you are breastfeeding.  Be aware of how much alcohol is in your drink. In the [place]., one drink equals one 12 oz bottle of beer (355 mL), one 5 oz glass of wine (148 mL), or one 1 oz glass of hard liquor (44 mL). General instructions  Schedule regular health, dental, and eye exams.  Stay current with your vaccines.  Tell your health care provider if: ? You often feel depressed. ? You have ever been abused or do not feel safe at home. Summary  Adopting a healthy lifestyle and getting preventive care are important in promoting health and wellness.  Follow your health care provider's instructions about healthy diet, exercising, and getting tested or screened for diseases.  Follow your health care provider's  instructions on monitoring your cholesterol and blood pressure. This information is not intended to replace advice given to you by your health care provider. Make sure you discuss any questions you have with your health care provider. Document Revised: 08/20/2018 Document Reviewed: 08/20/2018 Elsevier Patient Education  2020 [employer].

## 2019-10-01 NOTE — Assessment & Plan Note (Signed)
Refilled tessalon perles (uses if getting URI) and flonase (uses only with symptoms).

## 2019-10-01 NOTE — Assessment & Plan Note (Signed)
Using maalox daily which is helping symptoms well at this time.

## 2019-10-01 NOTE — Assessment & Plan Note (Signed)
Flu shot up to date. Pneumonia complete. Shingrix complete. Tetanus due 2022. Colonoscopy due now defers due to pandemic. Mammogram due 2021, pap smear aged out and dexa due 2022. Counseled about sun safety and mole surveillance. Counseled about the dangers of distracted driving. Given 10 year screening recommendations.

## 2019-10-01 NOTE — Assessment & Plan Note (Signed)
Checking lipid panel and adjust simvastatin as needed.  

## 2019-10-05 ENCOUNTER — Other Ambulatory Visit: Payer: Self-pay | Admitting: Internal Medicine

## 2019-10-05 DIAGNOSIS — E785 Hyperlipidemia, unspecified: Secondary | ICD-10-CM

## 2019-10-05 MED ORDER — SIMVASTATIN 40 MG PO TABS: 40.0000 mg | ORAL_TABLET | Freq: Every day | ORAL | 3 refills | Status: DC

## 2019-11-09 ENCOUNTER — Ambulatory Visit: Payer: Medicare Other | Attending: Internal Medicine

## 2019-11-09 DIAGNOSIS — Z23 Encounter for immunization: Secondary | ICD-10-CM | POA: Insufficient documentation

## 2019-11-09 NOTE — Progress Notes (Signed)
   Covid-19 Vaccination Clinic  Name:  Lamanda Wahlman    MRN: [id number] DOB: 19-Jul-1946  11/09/2019  Ms. Danbury was observed post Covid-19 immunization for 15 minutes without incidence. She was provided with Vaccine Information Sheet and instruction to access the V-Safe system.   Ms. Abdellatif was instructed to call 911 with any severe reactions post vaccine: [name] Difficulty breathing  . Swelling of your face and throat  . A fast heartbeat  . A bad rash all over your body  . Dizziness and weakness    Immunizations Administered    Name Date Dose VIS Date Route   Pfizer COVID-19 Vaccine 11/09/2019  8:26 AM 0.3 mL 08/21/2019 Intramuscular   Manufacturer: [place]   Lot: [id number]   [place]: [id number]

## 2019-11-17 ENCOUNTER — Other Ambulatory Visit: Payer: Self-pay | Admitting: Obstetrics and Gynecology

## 2019-11-17 DIAGNOSIS — Z1231 Encounter for screening mammogram for malignant neoplasm of breast: Secondary | ICD-10-CM

## 2019-12-08 ENCOUNTER — Ambulatory Visit: Payer: Medicare Other | Attending: Internal Medicine

## 2019-12-08 DIAGNOSIS — Z23 Encounter for immunization: Secondary | ICD-10-CM

## 2019-12-08 NOTE — Progress Notes (Signed)
   Covid-19 Vaccination Clinic  Name:  Olivia Mendoza    MRN: [id number] DOB: 1946/05/29  12/08/2019  Olivia Mendoza was observed post Covid-19 immunization for 15 minutes without incident. She was provided with Vaccine Information Sheet and instruction to access the V-Safe system.   Olivia Mendoza was instructed to call 911 with any severe reactions post vaccine: [name] Difficulty breathing  . Swelling of face and throat  . A fast heartbeat  . A bad rash all over body  . Dizziness and weakness   Immunizations Administered    Name Date Dose VIS Date Route   Pfizer COVID-19 Vaccine 12/08/2019  8:43 AM 0.3 mL 08/21/2019 Intramuscular   Manufacturer: [place]   Lot: [id number]   [place]: [id number]

## 2020-01-19 ENCOUNTER — Other Ambulatory Visit: Payer: Self-pay

## 2020-01-19 ENCOUNTER — Ambulatory Visit
Admission: RE | Admit: 2020-01-19 | Discharge: 2020-01-19 | Disposition: A | Discharge status: Home / Self Care | Payer: Medicare Other | Source: Ambulatory Visit | Attending: Obstetrics and Gynecology | Admitting: Obstetrics and Gynecology

## 2020-01-19 DIAGNOSIS — Z1231 Encounter for screening mammogram for malignant neoplasm of breast: Secondary | ICD-10-CM | POA: Diagnosis not present

## 2020-01-21 DIAGNOSIS — H2513 Age-related nuclear cataract, bilateral: Secondary | ICD-10-CM | POA: Diagnosis not present

## 2020-01-21 DIAGNOSIS — H524 Presbyopia: Secondary | ICD-10-CM | POA: Diagnosis not present

## 2020-01-21 DIAGNOSIS — H5213 Myopia, bilateral: Secondary | ICD-10-CM | POA: Diagnosis not present

## 2020-06-20 DIAGNOSIS — Z23 Encounter for immunization: Secondary | ICD-10-CM | POA: Diagnosis not present

## 2020-09-30 ENCOUNTER — Encounter: Payer: Medicare Other | Admitting: Internal Medicine

## 2020-10-12 ENCOUNTER — Ambulatory Visit (INDEPENDENT_AMBULATORY_CARE_PROVIDER_SITE_OTHER): Payer: Medicare Other

## 2020-10-12 ENCOUNTER — Other Ambulatory Visit: Payer: Self-pay | Admitting: Internal Medicine

## 2020-10-12 DIAGNOSIS — Z0000 Encounter for general adult medical examination without abnormal findings: Secondary | ICD-10-CM

## 2020-10-12 DIAGNOSIS — E785 Hyperlipidemia, unspecified: Secondary | ICD-10-CM

## 2020-10-12 NOTE — Patient Instructions (Signed)
Olivia Mendoza , Thank you for taking time to come for your Medicare Wellness Visit. I appreciate your ongoing commitment to your health goals. Please review the following plan we discussed and let me know if I can assist you in the future.   Screening recommendations/referrals: Colonoscopy: last done 12/26/2007; patient refused repeat Mammogram: 01/19/2020 Bone Density: never done Recommended yearly ophthalmology/optometry visit for glaucoma screening and checkup Recommended yearly dental visit for hygiene and checkup  Vaccinations: Influenza vaccine: 06/20/2020 Pneumococcal vaccine: up to date; may need Pneumovax 23 booster Tdap vaccine: 12/20/2010; due every 10 years Shingles vaccine: up to date   Covid-19: up to date  Advanced directives: Please bring a copy of your health care power of attorney and living will to the office at your convenience.  Conditions/risks identified: Yes; Reviewed health maintenance screenings with patient today and relevant education, vaccines, and/or referrals were provided. Please continue to do your personal lifestyle choices by: daily care of teeth and gums, regular physical activity (goal should be 5 days a week for 30 minutes), eat a healthy diet, avoid tobacco and drug use, limiting any alcohol intake, taking a low-dose aspirin (if not allergic or have been advised by your provider otherwise) and taking vitamins and minerals as recommended by your provider. Continue doing brain stimulating activities (puzzles, reading, adult coloring books, staying active) to keep memory sharp. Continue to eat heart healthy diet (full of fruits, vegetables, whole grains, lean protein, water--limit salt, fat, and sugar intake) and increase physical activity as tolerated.  Next appointment: Please schedule your next Medicare Wellness Visit with your Nurse Health Advisor in 1 year by calling [phone].   Preventive Care 76 Years and Older, Female Preventive care refers to  lifestyle choices and visits with your health care provider that can promote health and wellness. What does preventive care include?  A yearly physical exam. This is also called an annual well check.  Dental exams once or twice a year.  Routine eye exams. Ask your health care provider how often you should have your eyes checked.  Personal lifestyle choices, including:  Daily care of your teeth and gums.  Regular physical activity.  Eating a healthy diet.  Avoiding tobacco and drug use.  Limiting alcohol use.  Practicing safe sex.  Taking low-dose aspirin every day.  Taking vitamin and mineral supplements as recommended by your health care provider. What happens during an annual well check? The services and screenings done by your health care provider during your annual well check will depend on your age, overall health, lifestyle risk factors, and family history of disease. Counseling  Your health care provider may ask you questions about your:  Alcohol use.  Tobacco use.  Drug use.  Emotional well-being.  Home and relationship well-being.  Sexual activity.  Eating habits.  History of falls.  Memory and ability to understand (cognition).  Work and work Statistician.  Reproductive health. Screening  You may have the following tests or measurements:  Height, weight, and BMI.  Blood pressure.  Lipid and cholesterol levels. These may be checked every 5 years, or more frequently if you are over 52 years old.  Skin check.  Lung cancer screening. You may have this screening every year starting at age 86 if you have a 30-pack-year history of smoking and currently smoke or have quit within the past 15 years.  Fecal occult blood test (FOBT) of the stool. You may have this test every year starting at age 70.  Flexible sigmoidoscopy or  colonoscopy. You may have a sigmoidoscopy every 5 years or a colonoscopy every 10 years starting at age 48.  Hepatitis C blood  test.  Hepatitis B blood test.  Sexually transmitted disease (STD) testing.  Diabetes screening. This is done by checking your blood sugar (glucose) after you have not eaten for a while (fasting). You may have this done every 1-3 years.  Bone density scan. This is done to screen for osteoporosis. You may have this done starting at age 39.  Mammogram. This may be done every 1-2 years. Talk to your health care provider about how often you should have regular mammograms. Talk with your health care provider about your test results, treatment options, and if necessary, the need for more tests. Vaccines  Your health care provider may recommend certain vaccines, such as:  Influenza vaccine. This is recommended every year.  Tetanus, diphtheria, and acellular pertussis (Tdap, Td) vaccine. You may need a Td booster every 10 years.  Zoster vaccine. You may need this after age 80.  Pneumococcal 13-valent conjugate (PCV13) vaccine. One dose is recommended after age 50.  Pneumococcal polysaccharide (PPSV23) vaccine. One dose is recommended after age 18. Talk to your health care provider about which screenings and vaccines you need and how often you need them. This information is not intended to replace advice given to you by your health care provider. Make sure you discuss any questions you have with your health care provider. Document Released: 09/23/2015 Document Revised: 05/16/2016 Document Reviewed: 06/28/2015 Elsevier Interactive Patient Education  2017 [place] Prevention in the Home Falls can cause injuries. They can happen to people of all ages. There are many things you can do to make your home safe and to help prevent falls. What can I do on the outside of my home?  Regularly fix the edges of walkways and driveways and fix any cracks.  Remove anything that might make you trip as you walk through a door, such as a raised step or threshold.  Trim any bushes or trees on the  path to your home.  Use bright outdoor lighting.  Clear any walking paths of anything that might make someone trip, such as rocks or tools.  Regularly check to see if handrails are loose or broken. Make sure that both sides of any steps have handrails.  Any raised decks and porches should have guardrails on the edges.  Have any leaves, snow, or ice cleared regularly.  Use sand or salt on walking paths during winter.  Clean up any spills in your garage right away. This includes oil or grease spills. What can I do in the bathroom?  Use night lights.  Install grab bars by the toilet and in the tub and shower. Do not use towel bars as grab bars.  Use non-skid mats or decals in the tub or shower.  If you need to sit down in the shower, use a plastic, non-slip stool.  Keep the floor dry. Clean up any water that spills on the floor as soon as it happens.  Remove soap buildup in the tub or shower regularly.  Attach bath mats securely with double-sided non-slip rug tape.  Do not have throw rugs and other things on the floor that can make you trip. What can I do in the bedroom?  Use night lights.  Make sure that you have a light by your bed that is easy to reach.  Do not use any sheets or blankets that are too  big for your bed. They should not hang down onto the floor.  Have a firm chair that has side arms. You can use this for support while you get dressed.  Do not have throw rugs and other things on the floor that can make you trip. What can I do in the kitchen?  Clean up any spills right away.  Avoid walking on wet floors.  Keep items that you use a lot in easy-to-reach places.  If you need to reach something above you, use a strong step stool that has a grab bar.  Keep electrical cords out of the way.  Do not use floor polish or wax that makes floors slippery. If you must use wax, use non-skid floor wax.  Do not have throw rugs and other things on the floor that can  make you trip. What can I do with my stairs?  Do not leave any items on the stairs.  Make sure that there are handrails on both sides of the stairs and use them. Fix handrails that are broken or loose. Make sure that handrails are as long as the stairways.  Check any carpeting to make sure that it is firmly attached to the stairs. Fix any carpet that is loose or worn.  Avoid having throw rugs at the top or bottom of the stairs. If you do have throw rugs, attach them to the floor with carpet tape.  Make sure that you have a light switch at the top of the stairs and the bottom of the stairs. If you do not have them, ask someone to add them for you. What else can I do to help prevent falls?  Wear shoes that:  Do not have high heels.  Have rubber bottoms.  Are comfortable and fit you well.  Are closed at the toe. Do not wear sandals.  If you use a stepladder:  Make sure that it is fully opened. Do not climb a closed stepladder.  Make sure that both sides of the stepladder are locked into place.  Ask someone to hold it for you, if possible.  Clearly mark and make sure that you can see:  Any grab bars or handrails.  First and last steps.  Where the edge of each step is.  Use tools that help you move around (mobility aids) if they are needed. These include:  Canes.  Walkers.  Scooters.  Crutches.  Turn on the lights when you go into a dark area. Replace any light bulbs as soon as they burn out.  Set up your furniture so you have a clear path. Avoid moving your furniture around.  If any of your floors are uneven, fix them.  If there are any pets around you, be aware of where they are.  Review your medicines with your doctor. Some medicines can make you feel dizzy. This can increase your chance of falling. Ask your doctor what other things that you can do to help prevent falls. This information is not intended to replace advice given to you by your health care  provider. Make sure you discuss any questions you have with your health care provider. Document Released: 06/23/2009 Document Revised: 02/02/2016 Document Reviewed: 10/01/2014 Elsevier Interactive Patient Education  2017 [employer].

## 2020-10-12 NOTE — Progress Notes (Signed)
I connected with Olivia Mendoza today by telephone and verified that I am speaking with the correct person using two identifiers. Location patient: home Location provider: work Persons participating in the virtual visit: Berneda Piccininni and Lisette Abu, LPN.   I discussed the limitations, risks, security and privacy concerns of performing an evaluation and management service by telephone and the availability of in person appointments. I also discussed with the patient that there may be a patient responsible charge related to this service. The patient expressed understanding and verbally consented to this telephonic visit.    Interactive audio and video telecommunications were attempted between this provider and patient, however failed, due to patient having technical difficulties OR patient did not have access to video capability.  We continued and completed visit with audio only.  Some vital signs may be absent or patient reported.   Time Spent with patient on telephone encounter: 40 minutes  Subjective:   Olivia Mendoza is a 75 y.o. female who presents for Medicare Annual (Subsequent) preventive examination.  Review of Systems    No ROS. Medicare Wellness Visit. Additional risk factors are reflected in social history. Cardiac Risk Factors include: advanced age (>21men, >79 women);dyslipidemia;family history of premature cardiovascular disease     Objective:    There were no vitals filed for this visit. There is no height or weight on file to calculate BMI.  Advanced Directives 10/12/2020  Does Patient Have a Medical Advance Directive? Yes  Type of Advance Directive Living will;Healthcare Power of Attorney  Does patient want to make changes to medical advance directive? No - Patient declined  Copy of [place] in Chart? No - copy requested    Current Medications (verified) Outpatient Encounter Medications as of 10/12/2020  Medication Sig  . famotidine  (PEPCID) 20 MG tablet Take 20 mg by mouth daily.  . Flaxseed, Linseed, (FLAX SEED OIL PO) Take 10 mLs by mouth daily.  [name] alum & mag hydroxide-simeth (MAALOX/MYLANTA) 200-200-20 MG/5ML suspension Take 10 mLs by mouth at bedtime.  [name] aspirin 81 MG EC tablet Take 81 mg by mouth daily.    . benzonatate (TESSALON) 200 MG capsule Take 1 capsule (200 mg total) by mouth 2 (two) times daily as needed for cough.  . Cholecalciferol (VITAMIN D) 2000 UNITS CAPS Take by mouth daily.    . Cyanocobalamin (B-12) 500 MCG TABS Take by mouth.  . fluticasone (FLONASE) 50 MCG/ACT nasal spray Place 2 sprays into both nostrils daily.  [name] loratadine (CLARITIN) 10 MG tablet Take 10 mg by mouth daily.  . meclizine (ANTIVERT) 12.5 MG tablet Take 1 tablet (12.5 mg total) by mouth 3 (three) times daily as needed for dizziness.  . simvastatin (ZOCOR) 40 MG tablet Take 1 tablet (40 mg total) by mouth daily at 6 PM.   No facility-administered encounter medications on file as of 10/12/2020.    Allergies (verified) Penicillins   History: Past Medical History:  Diagnosis Date  . Dysphagia   . Fibrocystic breast disease   . History of cystopexy   . History of migraines   . Hyperlipemia   . IBS (irritable bowel syndrome)   . Mononucleosis   . MVP (mitral valve prolapse)   . Positional vertigo   . PVC (premature ventricular contraction)   . Routine general medical examination at a health care facility    Past Surgical History:  Procedure Laterality Date  . BIOPSY THYROID    . BREAST CYST ASPIRATION    . LAPAROSCOPIC  TUBAL LIGATION     Bilateral  . VAGINAL HYSTERECTOMY  89   Family History  Problem Relation Age of Onset  . Hypertension Mother   . Hyperlipidemia Mother   . Stroke Mother   . Hiatal hernia Mother   . Arthritis Mother   . Heart disease Mother        PTVDP-ss syndrome (Dr Elisabeth Cara)  . Hemochromatosis Father   . Hyperlipidemia Father   . Hypertension Father   . Heart disease Father         CAD/CABG  . Heart disease Brother        AAA Stent, PVD s/p stent, CAD, lipids  . Breast cancer Maternal Grandmother 92   Social History   Socioeconomic History  . Marital status: Divorced    Spouse name: Not on file  . Number of children: Not on file  . Years of education: Not on file  . Highest education level: Not on file  Occupational History  . Occupation: Music therapist  Tobacco Use  . Smoking status: Never Smoker  . Smokeless tobacco: Never Used  Substance and Sexual Activity  . Alcohol use: Not on file  . Drug use: Not on file  . Sexual activity: Not on file  Other Topics Concern  . Not on file  Social History Narrative   [employer], [employer].  Married 58yrs - divorced.  2 daughters, 1 son died 81 days after birth, 5 grandchildren   Live alone - independently, caregiver with mother. Work - continues to work for [employer].            Social Determinants of Health   Financial Resource Strain: Low Risk   . Difficulty of Paying Living Expenses: Not hard at all  Food Insecurity: No Food Insecurity  . Worried About Charity fundraiser in the Last Year: Never true  . Ran Out of Food in the Last Year: Never true  Transportation Needs: No Transportation Needs  . Lack of Transportation (Medical): No  . Lack of Transportation (Non-Medical): No  Physical Activity: Sufficiently Active  . Days of Exercise per Week: 5 days  . Minutes of Exercise per Session: 30 min  Stress: No Stress Concern Present  . Feeling of Stress : Not at all  Social Connections: Moderately Integrated  . Frequency of Communication with Friends and Family: More than three times a week  . Frequency of Social Gatherings with Friends and Family: More than three times a week  . Attends Religious Services: More than 4 times per year  . Active Member of Clubs or Organizations: Yes  . Attends Archivist Meetings: More than 4 times per year  . Marital Status: Widowed     Tobacco Counseling Counseling given: Not Answered   Clinical Intake:  Pre-visit preparation completed: Yes  Pain : No/denies pain     Nutritional Risks: None Diabetes: No  How often do you need to have someone help you when you read instructions, pamphlets, or other written materials from your doctor or pharmacy?: 1 - Never What is the last grade level you completed in school?: [place]; 1 year of business school  Diabetic? no  Interpreter Needed?: No  Information entered by :: Lisette Abu, LPN   Activities of Daily Living In your present state of health, do you have any difficulty performing the following activities: 10/12/2020  Hearing? N  Vision? N  Difficulty concentrating or making decisions? N  Walking or climbing stairs? N  Dressing or bathing? N  Doing errands, shopping? N  Preparing Food and eating ? N  Using the Toilet? N  In the past six months, have you accidently leaked urine? N  Do you have problems with loss of bowel control? N  Managing your Medications? N  Managing your Finances? N  Housekeeping or managing your Housekeeping? N  Some recent data might be hidden    Patient Care Team: Hoyt Koch, MD as PCP - General (Internal Medicine) Lomax, Marny Lowenstein, MD (Inactive) (Obstetrics and Gynecology) Ralene Bathe, MD (Ophthalmology) Inda Castle, MD (Inactive) (Gastroenterology)  Indicate any recent Medical Services you may have received from other than [facility] providers in the past year (date may be approximate).     Assessment:   This is a routine wellness examination for Olivia Mendoza.  Hearing/Vision screen No exam data present  Dietary issues and exercise activities discussed: Current Exercise Habits: Home exercise routine, Type of exercise: walking, Time (Minutes): 30, Frequency (Times/Week): 5, Weekly Exercise (Minutes/Week): 150, Intensity: Moderate, Exercise limited by: None identified  Goals    . Client  understands the importance of follow-up with providers by attending scheduled visits     My goal is to get back to walking and being physically active.      Depression Screen PHQ 2/9 Scores 10/12/2020 09/30/2019 09/23/2018 09/19/2017 08/28/2016 08/22/2015  PHQ - 2 Score 0 0 1 0 0 0    Fall Risk Fall Risk  10/12/2020 09/30/2019 09/23/2018 09/19/2017 08/28/2016  Falls in the past year? 0 0 0 No No  Number falls in past yr: 0 - - - -  Injury with Fall? 0 - - - -  Risk for fall due to : No Fall Risks - - - -    FALL RISK PREVENTION PERTAINING TO THE HOME:  Any stairs in or around the home? No  If so, are there any without handrails? No  Home free of loose throw rugs in walkways, pet beds, electrical cords, etc? Yes  Adequate lighting in your home to reduce risk of falls? Yes   ASSISTIVE DEVICES UTILIZED TO PREVENT FALLS:  Life alert? No  Use of a cane, walker or w/c? No  Grab bars in the bathroom? Yes  Shower chair or bench in shower? Yes  Elevated toilet seat or a handicapped toilet? Yes   TIMED UP AND GO:  Was the test performed? No .  Length of time to ambulate 10 feet: 0 sec.   Gait steady and fast without use of assistive device  Cognitive Function: No flowsheet data found.        Immunizations Immunization History  Administered Date(s) Administered  . Influenza Whole 08/10/2009  . Influenza, High Dose Seasonal PF 08/08/2013, 06/23/2016, 06/16/2019  . Influenza,inj,Quad PF,6+ Mos 06/17/2014, 06/11/2018  . Influenza-Unspecified 07/04/2015, 06/28/2017, 06/16/2019  . PFIZER(Purple Top)SARS-COV-2 Vaccination 11/09/2019, 12/08/2019  . Pneumococcal Conjugate-13 08/22/2015  . Pneumococcal Polysaccharide-23 12/20/2010  . Tdap 12/20/2010  . Zoster 12/16/2007  . Zoster Recombinat (Shingrix) 12/23/2017, 05/22/2018    TDAP status: Up to date  Flu Vaccine status: Up to date  Pneumococcal vaccine status: Up to date  Covid-19 vaccine status: Completed vaccines  Qualifies  for Shingles Vaccine? Yes   Zostavax completed Yes   Shingrix Completed?: Yes  Screening Tests Health Maintenance  Topic Date Due  . COLONOSCOPY (Pts 45-47yrs Insurance coverage will need to be confirmed)  12/25/2017  . COVID-19 Vaccine (3 - Booster for Pfizer series) 06/09/2020  . TETANUS/TDAP  12/19/2020  .  INFLUENZA VACCINE  Completed  . DEXA SCAN  Completed  . Hepatitis C Screening  Completed  . PNA vac Low Risk Adult  Completed    Health Maintenance  Health Maintenance Due  Topic Date Due  . COLONOSCOPY (Pts 45-71yrs Insurance coverage will need to be confirmed)  12/25/2017  . COVID-19 Vaccine (3 - Booster for Pfizer series) 06/09/2020    Colorectal cancer screening: Type of screening: Colonoscopy. Completed 12/26/2007. Repeat every 10 years  Mammogram status: Completed 01/19/2020. Repeat every year  Bone density: never done  Lung Cancer Screening: (Low Dose CT Chest recommended if Age 57-80 years, 30 pack-year currently smoking OR have quit w/in 15years.) does not qualify.   Lung Cancer Screening Referral: no  Additional Screening:  Hepatitis C Screening: does qualify; Completed yes  Vision Screening: Recommended annual ophthalmology exams for early detection of glaucoma and other disorders of the eye. Is the patient up to date with their annual eye exam?  Yes  Who is the provider or what is the name of the office in which the patient attends annual eye exams? Katy Apo, MD. If pt is not established with a provider, would they like to be referred to a provider to establish care? No .   Dental Screening: Recommended annual dental exams for proper oral hygiene  Community Resource Referral / Chronic Care Management: CRR required this visit?  No   CCM required this visit?  No      Plan:     I have personally reviewed and noted the following in the patient's chart:   . Medical and social history . Use of alcohol, tobacco or illicit drugs  . Current  medications and supplements . Functional ability and status . Nutritional status . Physical activity . Advanced directives . List of other physicians . Hospitalizations, surgeries, and ER visits in previous 12 months . Vitals . Screenings to include cognitive, depression, and falls . Referrals and appointments  In addition, I have reviewed and discussed with patient certain preventive protocols, quality metrics, and best practice recommendations. A written personalized care plan for preventive services as well as general preventive health recommendations were provided to patient.     Sheral Flow, LPN   [id number]   Nurse Notes:  Patient is cogitatively intact. There were no vitals filed for this visit. There is no height or weight on file to calculate BMI. Patient stated that she has no issues with gait or balance; does not use any assistive devices.

## 2020-10-18 ENCOUNTER — Ambulatory Visit (INDEPENDENT_AMBULATORY_CARE_PROVIDER_SITE_OTHER): Payer: Medicare Other | Admitting: Internal Medicine

## 2020-10-18 ENCOUNTER — Encounter: Payer: Self-pay | Admitting: Internal Medicine

## 2020-10-18 ENCOUNTER — Other Ambulatory Visit: Payer: Self-pay

## 2020-10-18 VITALS — BP 124/62 | HR 79 | Temp 98.1°F | Resp 18 | Ht 67.0 in | Wt 187.6 lb

## 2020-10-18 DIAGNOSIS — E782 Mixed hyperlipidemia: Secondary | ICD-10-CM

## 2020-10-18 DIAGNOSIS — H8110 Benign paroxysmal vertigo, unspecified ear: Secondary | ICD-10-CM

## 2020-10-18 DIAGNOSIS — R1310 Dysphagia, unspecified: Secondary | ICD-10-CM

## 2020-10-18 DIAGNOSIS — J3089 Other allergic rhinitis: Secondary | ICD-10-CM | POA: Diagnosis not present

## 2020-10-18 DIAGNOSIS — Z0000 Encounter for general adult medical examination without abnormal findings: Secondary | ICD-10-CM

## 2020-10-18 LAB — COMPREHENSIVE METABOLIC PANEL
ALT: 17 U/L (ref 0–35)
AST: 21 U/L (ref 0–37)
Albumin: 4.4 g/dL (ref 3.5–5.2)
Alkaline Phosphatase: 103 U/L (ref 39–117)
BUN: 18 mg/dL (ref 6–23)
CO2: 28 mEq/L (ref 19–32)
Calcium: 9.7 mg/dL (ref 8.4–10.5)
Chloride: 102 mEq/L (ref 96–112)
Creatinine, Ser: 0.79 mg/dL (ref 0.40–1.20)
GFR: 73.13 mL/min (ref >60.00)
Glucose, Bld: 92 mg/dL (ref 70–99)
Potassium: 3.9 mEq/L (ref 3.5–5.1)
Sodium: 136 mEq/L (ref 135–145)
Total Bilirubin: 1.7 mg/dL — ABNORMAL HIGH (ref 0.2–1.2)
Total Protein: 7.5 g/dL (ref 6.0–8.3)

## 2020-10-18 LAB — LIPID PANEL
Cholesterol: 208 mg/dL — ABNORMAL HIGH (ref 0–200)
HDL: 51.6 mg/dL (ref >39.00)
NonHDL: 156.14
Total CHOL/HDL Ratio: 4
Triglycerides: 330 mg/dL — ABNORMAL HIGH (ref 0.0–149.0)
VLDL: 66 mg/dL — ABNORMAL HIGH (ref 0.0–40.0)

## 2020-10-18 LAB — LDL CHOLESTEROL, DIRECT: Direct LDL: 113 mg/dL

## 2020-10-18 LAB — CBC
HCT: 42.3 % (ref 36.0–46.0)
Hemoglobin: 14.6 g/dL (ref 12.0–15.0)
MCHC: 34.5 g/dL (ref 30.0–36.0)
MCV: 85.5 fl (ref 78.0–100.0)
Platelets: 334 10*3/uL (ref 150.0–400.0)
RBC: 4.94 Mil/uL (ref 3.87–5.11)
RDW: 13.1 % (ref 11.5–15.5)
WBC: 5 10*3/uL (ref 4.0–10.5)

## 2020-10-18 MED ORDER — BENZONATATE 200 MG PO CAPS: 200.0000 mg | ORAL_CAPSULE | Freq: Two times a day (BID) | ORAL | 3 refills | Status: DC | PRN

## 2020-10-18 MED ORDER — SIMVASTATIN 20 MG PO TABS: 20.0000 mg | ORAL_TABLET | Freq: Every day | ORAL | 3 refills | Status: DC

## 2020-10-18 MED ORDER — FLUTICASONE PROPIONATE 50 MCG/ACT NA SUSP
2.0000 | Freq: Every day | NASAL | 3 refills | Status: DC
Start: 2020-10-18 — End: 2021-01-13

## 2020-10-18 MED ORDER — MECLIZINE HCL 12.5 MG PO TABS: 12.5000 mg | ORAL_TABLET | Freq: Three times a day (TID) | ORAL | 3 refills | Status: DC | PRN

## 2020-10-18 NOTE — Assessment & Plan Note (Signed)
No current symptoms but needs refill of meclizine as hers has expired. She does keep this around prn for flares.

## 2020-10-18 NOTE — Patient Instructions (Addendum)
Your EKG is normal and not changed from before.   Health Maintenance, Female Adopting a healthy lifestyle and getting preventive care are important in promoting health and wellness. Ask your health care provider about:  The right schedule for you to have regular tests and exams.  Things you can do on your own to prevent diseases and keep yourself healthy. What should I know about diet, weight, and exercise? Eat a healthy diet  Eat a diet that includes plenty of vegetables, fruits, low-fat dairy products, and lean protein.  Do not eat a lot of foods that are high in solid fats, added sugars, or sodium.   Maintain a healthy weight Body mass index (BMI) is used to identify weight problems. It estimates body fat based on height and weight. Your health care provider can help determine your BMI and help you achieve or maintain a healthy weight. Get regular exercise Get regular exercise. This is one of the most important things you can do for your health. Most adults should:  Exercise for at least 150 minutes each week. The exercise should increase your heart rate and make you sweat (moderate-intensity exercise).  Do strengthening exercises at least twice a week. This is in addition to the moderate-intensity exercise.  Spend less time sitting. Even light physical activity can be beneficial. Watch cholesterol and blood lipids Have your blood tested for lipids and cholesterol at 76 years of age, then have this test every 5 years. Have your cholesterol levels checked more often if:  Your lipid or cholesterol levels are high.  You are older than 76 years of age.  You are at high risk for heart disease. What should I know about cancer screening? Depending on your health history and family history, you may need to have cancer screening at various ages. This may include screening for:  Breast cancer.  Cervical cancer.  Colorectal cancer.  Skin cancer.  Lung cancer. What should I know  about heart disease, diabetes, and high blood pressure? Blood pressure and heart disease  High blood pressure causes heart disease and increases the risk of stroke. This is more likely to develop in people who have high blood pressure readings, are of African descent, or are overweight.  Have your blood pressure checked: ? Every 3-5 years if you are 62-44 years of age. ? Every year if you are 29 years old or older. Diabetes Have regular diabetes screenings. This checks your fasting blood sugar level. Have the screening done:  Once every three years after age 33 if you are at a normal weight and have a low risk for diabetes.  More often and at a younger age if you are overweight or have a high risk for diabetes. What should I know about preventing infection? Hepatitis B If you have a higher risk for hepatitis B, you should be screened for this virus. Talk with your health care provider to find out if you are at risk for hepatitis B infection. Hepatitis C Testing is recommended for:  Everyone born from [birth year] through 1966.  Anyone with known risk factors for hepatitis C. Sexually transmitted infections (STIs)  Get screened for STIs, including gonorrhea and chlamydia, if: ? You are sexually active and are younger than 76 years of age. ? You are older than 76 years of age and your health care provider tells you that you are at risk for this type of infection. ? Your sexual activity has changed since you were last screened, and you are at  increased risk for chlamydia or gonorrhea. Ask your health care provider if you are at risk.  Ask your health care provider about whether you are at high risk for HIV. Your health care provider may recommend a prescription medicine to help prevent HIV infection. If you choose to take medicine to prevent HIV, you should first get tested for HIV. You should then be tested every 3 months for as long as you are taking the medicine. Pregnancy  If you are about  to stop having your period (premenopausal) and you may become pregnant, seek counseling before you get pregnant.  Take 400 to 800 micrograms (mcg) of folic acid every day if you become pregnant.  Ask for birth control (contraception) if you want to prevent pregnancy. Osteoporosis and menopause Osteoporosis is a disease in which the bones lose minerals and strength with aging. This can result in bone fractures. If you are 51 years old or older, or if you are at risk for osteoporosis and fractures, ask your health care provider if you should:  Be screened for bone loss.  Take a calcium or vitamin D supplement to lower your risk of fractures.  Be given hormone replacement therapy (HRT) to treat symptoms of menopause. Follow these instructions at home: Lifestyle  Do not use any products that contain nicotine or tobacco, such as cigarettes, e-cigarettes, and chewing tobacco. If you need help quitting, ask your health care provider.  Do not use street drugs.  Do not share needles.  Ask your health care provider for help if you need support or information about quitting drugs. Alcohol use  Do not drink alcohol if: ? Your health care provider tells you not to drink. ? You are pregnant, may be pregnant, or are planning to become pregnant.  If you drink alcohol: ? Limit how much you use to 0-1 drink a day. ? Limit intake if you are breastfeeding.  Be aware of how much alcohol is in your drink. In the [place] one drink equals one 12 oz bottle of beer (355 mL), one 5 oz glass of wine (148 mL), or one 1 oz glass of hard liquor (44 mL). General instructions  Schedule regular health, dental, and eye exams.  Stay current with your vaccines.  Tell your health care provider if: ? You often feel depressed. ? You have ever been abused or do not feel safe at home. Summary  Adopting a healthy lifestyle and getting preventive care are important in promoting health and wellness.  Follow your  health care provider's instructions about healthy diet, exercising, and getting tested or screened for diseases.  Follow your health care provider's instructions on monitoring your cholesterol and blood pressure. This information is not intended to replace advice given to you by your health care provider. Make sure you discuss any questions you have with your health care provider. Document Revised: 08/20/2018 Document Reviewed: 08/20/2018 Elsevier Patient Education  2021 [employer].

## 2020-10-18 NOTE — Progress Notes (Signed)
   Subjective:   Patient ID: Olivia Mendoza, female    DOB: [birth year], 75 y.o.   MRN: [id number]  HPI The patient is a 75 YO female coming in for follow up of cholesterol (would like to reduce simvastatin from 40 mg to 20 mg depending on results, having some arthritis pain, no EKG in about 10 years, no chest pains or stroke symptoms, not exercising currently), and GERD (taking pepcid 20 mg daily, not having problems with swallowing currently, did cut out caffeine which made a big difference and chocolate, denies breakthrough GERD symptoms except rare) and bppv (no flare currently but out of refills for meclizine due to them expiring, she has some pills at home which are old, she does not want a large amount as she does not use them often, last flare more than a year ago, she is cautious at the dentist due to the change in position laying back this can sometimes trigger a flare if she is not cautious).   Review of Systems  Constitutional: Negative.   HENT: Negative.   Eyes: Negative.   Respiratory: Negative for cough, chest tightness and shortness of breath.   Cardiovascular: Negative for chest pain, palpitations and leg swelling.  Gastrointestinal: Negative for abdominal distention, abdominal pain, constipation, diarrhea, nausea and vomiting.  Musculoskeletal: Negative.   Skin: Negative.   Neurological: Negative.   Psychiatric/Behavioral: Negative.     Objective:  Physical Exam Constitutional:      Appearance: She is well-developed and well-nourished.  HENT:     Head: Normocephalic and atraumatic.  Eyes:     Extraocular Movements: EOM normal.  Cardiovascular:     Rate and Rhythm: Normal rate and regular rhythm.  Pulmonary:     Effort: Pulmonary effort is normal. No respiratory distress.     Breath sounds: Normal breath sounds. No wheezing or rales.  Abdominal:     General: Bowel sounds are normal. There is no distension.     Palpations: Abdomen is soft.     Tenderness: There is no  abdominal tenderness. There is no rebound.  Musculoskeletal:        General: No edema.     Cervical back: Normal range of motion.  Skin:    General: Skin is warm and dry.  Neurological:     Mental Status: She is alert and oriented to person, place, and time.     Coordination: Coordination normal.  Psychiatric:        Mood and Affect: Mood and affect normal.     Vitals:   10/18/20 0953  BP: 124/62  Pulse: 79  Resp: 18  Temp: 98.1 F (36.7 C)  TempSrc: Oral  SpO2: 97%  Weight: 187 lb 9.6 oz (85.1 kg)  Height: 5\' 7"  (1.702 m)   EKG: Rate 69, axis normal, interval normal, sinus, no t wave changes or st changes, no significant change from 2011  This visit occurred during the SARS-CoV-2 public health emergency.  Safety protocols were in place, including screening questions prior to the visit, additional usage of staff PPE, and extensive cleaning of exam room while observing appropriate contact time as indicated for disinfecting solutions.   Assessment & Plan:

## 2020-10-18 NOTE — Assessment & Plan Note (Signed)
Checking lipid panel and will reduce simvastatin dosing to 20 mg daily to help with joint pain symptoms.

## 2020-10-18 NOTE — Assessment & Plan Note (Signed)
Refill flonase which she uses prn only. No current symptoms but usually spring is worst for her.

## 2020-10-18 NOTE — Assessment & Plan Note (Signed)
Continue pepcid 20 mg daily, prior EGD and no indication for repeat currently. Stopping chocolate has helped as well.

## 2020-11-07 ENCOUNTER — Telehealth: Payer: Self-pay | Admitting: Internal Medicine

## 2020-11-07 NOTE — Telephone Encounter (Signed)
Patient is going on a trip to [place] and she is going on an airplane for the first time in over 20 years and she gets really bad motion sickness and she previously had gotten a patch behind her ear and she is wondering if we could send that in for her. She would like to hear from [place] before we send it in

## 2020-11-08 NOTE — Telephone Encounter (Signed)
We could do scopolamine patch which last 3 days and you place behind the ear. The main side effect is dry mouth which generally is mild. It can be bad enough that people remove the patch. Make sure to wash hands well after applying or removing patch as the medicine can be harmful if it accidentally gets into the eye.

## 2020-11-08 NOTE — Telephone Encounter (Signed)
See below

## 2020-11-09 MED ORDER — SCOPOLAMINE 1 MG/3DAYS TD PT72: 1.0000 | MEDICATED_PATCH | TRANSDERMAL | 0 refills | Status: DC

## 2020-11-09 NOTE — Addendum Note (Signed)
Addended by: Pricilla Holm A on: 11/09/2020 09:06 AM   Modules accepted: Orders

## 2020-11-09 NOTE — Telephone Encounter (Signed)
Spoke with the patient and she would love to have the scopolamine patch for her trip there and back. No other concerns at this time.

## 2020-11-09 NOTE — Telephone Encounter (Signed)
Sent in

## 2020-11-14 ENCOUNTER — Telehealth: Payer: Self-pay

## 2020-11-14 NOTE — Telephone Encounter (Signed)
Patch has been approved through 11/14/2021.

## 2020-11-14 NOTE — Telephone Encounter (Signed)
PA has been initiated for Scopolamine 1 mg. Key: [id number] Rx #: [id number] PA Case ID: [id number]    I will update once a decision has been made.

## 2020-12-12 ENCOUNTER — Other Ambulatory Visit: Payer: Self-pay | Admitting: Internal Medicine

## 2020-12-12 DIAGNOSIS — Z1231 Encounter for screening mammogram for malignant neoplasm of breast: Secondary | ICD-10-CM

## 2021-01-13 ENCOUNTER — Other Ambulatory Visit: Payer: Self-pay | Admitting: Internal Medicine

## 2021-01-13 DIAGNOSIS — J3089 Other allergic rhinitis: Secondary | ICD-10-CM

## 2021-01-25 DIAGNOSIS — Z01419 Encounter for gynecological examination (general) (routine) without abnormal findings: Secondary | ICD-10-CM | POA: Diagnosis not present

## 2021-01-25 DIAGNOSIS — Z6830 Body mass index (BMI) 30.0-30.9, adult: Secondary | ICD-10-CM | POA: Diagnosis not present

## 2021-01-25 DIAGNOSIS — N959 Unspecified menopausal and perimenopausal disorder: Secondary | ICD-10-CM | POA: Diagnosis not present

## 2021-01-26 DIAGNOSIS — H5213 Myopia, bilateral: Secondary | ICD-10-CM | POA: Diagnosis not present

## 2021-01-26 DIAGNOSIS — H2513 Age-related nuclear cataract, bilateral: Secondary | ICD-10-CM | POA: Diagnosis not present

## 2021-01-31 ENCOUNTER — Ambulatory Visit
Admission: RE | Admit: 2021-01-31 | Discharge: 2021-01-31 | Disposition: A | Discharge status: Home / Self Care | Payer: Medicare Other | Source: Ambulatory Visit | Attending: Internal Medicine | Admitting: Internal Medicine

## 2021-01-31 ENCOUNTER — Other Ambulatory Visit: Payer: Self-pay

## 2021-01-31 DIAGNOSIS — Z1231 Encounter for screening mammogram for malignant neoplasm of breast: Secondary | ICD-10-CM | POA: Diagnosis not present

## 2021-07-12 DIAGNOSIS — Z23 Encounter for immunization: Secondary | ICD-10-CM | POA: Diagnosis not present

## 2021-10-06 ENCOUNTER — Other Ambulatory Visit: Payer: Self-pay | Admitting: Internal Medicine

## 2021-10-16 ENCOUNTER — Other Ambulatory Visit: Payer: Self-pay

## 2021-10-16 ENCOUNTER — Ambulatory Visit (INDEPENDENT_AMBULATORY_CARE_PROVIDER_SITE_OTHER): Payer: Medicare Other

## 2021-10-16 DIAGNOSIS — Z0000 Encounter for general adult medical examination without abnormal findings: Secondary | ICD-10-CM

## 2021-10-16 NOTE — Patient Instructions (Addendum)
Ms. Borger , Thank you for taking time to come for your Medicare Wellness Visit. I appreciate your ongoing commitment to your health goals. Please review the following plan we discussed and let me know if I can assist you in the future.   Screening recommendations/referrals: Colonoscopy: Not a candidate for screening due to age 76: 01/31/2021; due every 1-2 years Bone Density: no record found Recommended yearly ophthalmology/optometry visit for glaucoma screening and checkup Recommended yearly dental visit for hygiene and checkup  Vaccinations: Influenza vaccine: 07/12/2021 Pneumococcal vaccine: 12/20/2010, 08/22/2015 Tdap vaccine: 12/20/2010; due every 10 years (overdue) Shingles vaccine: 12/23/2017, 05/22/2018   Covid-19: 11/09/2019, 12/08/2019, 06/25/2020  Advanced directives: Please bring a copy of your health care power of attorney and living will to the office at your convenience.  Conditions/risks identified: Yes; Client understands the importance of follow-up with providers by attending scheduled visits and discussed goals to eat healthier, increase physical activity, exercise the brain, socialize more, get enough sleep and make time for laughter.  Next appointment: Please schedule your next Medicare Wellness Visit with your Nurse Health Advisor in 1 year by calling [phone].   Preventive Care 16 Years and Older, Female Preventive care refers to lifestyle choices and visits with your health care provider that can promote health and wellness. What does preventive care include? A yearly physical exam. This is also called an annual well check. Dental exams once or twice a year. Routine eye exams. Ask your health care provider how often you should have your eyes checked. Personal lifestyle choices, including: Daily care of your teeth and gums. Regular physical activity. Eating a healthy diet. Avoiding tobacco and drug use. Limiting alcohol use. Practicing safe sex. Taking  low-dose aspirin every day. Taking vitamin and mineral supplements as recommended by your health care provider. What happens during an annual well check? The services and screenings done by your health care provider during your annual well check will depend on your age, overall health, lifestyle risk factors, and family history of disease. Counseling  Your health care provider may ask you questions about your: Alcohol use. Tobacco use. Drug use. Emotional well-being. Home and relationship well-being. Sexual activity. Eating habits. History of falls. Memory and ability to understand (cognition). Work and work Statistician. Reproductive health. Screening  You may have the following tests or measurements: Height, weight, and BMI. Blood pressure. Lipid and cholesterol levels. These may be checked every 5 years, or more frequently if you are over 18 years old. Skin check. Lung cancer screening. You may have this screening every year starting at age 35 if you have a 30-pack-year history of smoking and currently smoke or have quit within the past 15 years. Fecal occult blood test (FOBT) of the stool. You may have this test every year starting at age 42. Flexible sigmoidoscopy or colonoscopy. You may have a sigmoidoscopy every 5 years or a colonoscopy every 10 years starting at age 55. Hepatitis C blood test. Hepatitis B blood test. Sexually transmitted disease (STD) testing. Diabetes screening. This is done by checking your blood sugar (glucose) after you have not eaten for a while (fasting). You may have this done every 1-3 years. Bone density scan. This is done to screen for osteoporosis. You may have this done starting at age 42. Mammogram. This may be done every 1-2 years. Talk to your health care provider about how often you should have regular mammograms. Talk with your health care provider about your test results, treatment options, and if necessary, the need  for more tests. Vaccines   Your health care provider may recommend certain vaccines, such as: Influenza vaccine. This is recommended every year. Tetanus, diphtheria, and acellular pertussis (Tdap, Td) vaccine. You may need a Td booster every 10 years. Zoster vaccine. You may need this after age 48. Pneumococcal 13-valent conjugate (PCV13) vaccine. One dose is recommended after age 68. Pneumococcal polysaccharide (PPSV23) vaccine. One dose is recommended after age 56. Talk to your health care provider about which screenings and vaccines you need and how often you need them. This information is not intended to replace advice given to you by your health care provider. Make sure you discuss any questions you have with your health care provider. Document Released: 09/23/2015 Document Revised: 05/16/2016 Document Reviewed: 06/28/2015 Elsevier Interactive Patient Education  2017 [place] Prevention in the Home Falls can cause injuries. They can happen to people of all ages. There are many things you can do to make your home safe and to help prevent falls. What can I do on the outside of my home? Regularly fix the edges of walkways and driveways and fix any cracks. Remove anything that might make you trip as you walk through a door, such as a raised step or threshold. Trim any bushes or trees on the path to your home. Use bright outdoor lighting. Clear any walking paths of anything that might make someone trip, such as rocks or tools. Regularly check to see if handrails are loose or broken. Make sure that both sides of any steps have handrails. Any raised decks and porches should have guardrails on the edges. Have any leaves, snow, or ice cleared regularly. Use sand or salt on walking paths during winter. Clean up any spills in your garage right away. This includes oil or grease spills. What can I do in the bathroom? Use night lights. Install grab bars by the toilet and in the tub and shower. Do not use towel  bars as grab bars. Use non-skid mats or decals in the tub or shower. If you need to sit down in the shower, use a plastic, non-slip stool. Keep the floor dry. Clean up any water that spills on the floor as soon as it happens. Remove soap buildup in the tub or shower regularly. Attach bath mats securely with double-sided non-slip rug tape. Do not have throw rugs and other things on the floor that can make you trip. What can I do in the bedroom? Use night lights. Make sure that you have a light by your bed that is easy to reach. Do not use any sheets or blankets that are too big for your bed. They should not hang down onto the floor. Have a firm chair that has side arms. You can use this for support while you get dressed. Do not have throw rugs and other things on the floor that can make you trip. What can I do in the kitchen? Clean up any spills right away. Avoid walking on wet floors. Keep items that you use a lot in easy-to-reach places. If you need to reach something above you, use a strong step stool that has a grab bar. Keep electrical cords out of the way. Do not use floor polish or wax that makes floors slippery. If you must use wax, use non-skid floor wax. Do not have throw rugs and other things on the floor that can make you trip. What can I do with my stairs? Do not leave any items on the stairs.  Make sure that there are handrails on both sides of the stairs and use them. Fix handrails that are broken or loose. Make sure that handrails are as long as the stairways. Check any carpeting to make sure that it is firmly attached to the stairs. Fix any carpet that is loose or worn. Avoid having throw rugs at the top or bottom of the stairs. If you do have throw rugs, attach them to the floor with carpet tape. Make sure that you have a light switch at the top of the stairs and the bottom of the stairs. If you do not have them, ask someone to add them for you. What else can I do to help  prevent falls? Wear shoes that: Do not have high heels. Have rubber bottoms. Are comfortable and fit you well. Are closed at the toe. Do not wear sandals. If you use a stepladder: Make sure that it is fully opened. Do not climb a closed stepladder. Make sure that both sides of the stepladder are locked into place. Ask someone to hold it for you, if possible. Clearly mark and make sure that you can see: Any grab bars or handrails. First and last steps. Where the edge of each step is. Use tools that help you move around (mobility aids) if they are needed. These include: Canes. Walkers. Scooters. Crutches. Turn on the lights when you go into a dark area. Replace any light bulbs as soon as they burn out. Set up your furniture so you have a clear path. Avoid moving your furniture around. If any of your floors are uneven, fix them. If there are any pets around you, be aware of where they are. Review your medicines with your doctor. Some medicines can make you feel dizzy. This can increase your chance of falling. Ask your doctor what other things that you can do to help prevent falls. This information is not intended to replace advice given to you by your health care provider. Make sure you discuss any questions you have with your health care provider. Document Released: 06/23/2009 Document Revised: 02/02/2016 Document Reviewed: 10/01/2014 Elsevier Interactive Patient Education  2017 [employer].

## 2021-10-16 NOTE — Progress Notes (Signed)
I connected with [name] today by telephone and verified that I am speaking with the correct person using two identifiers. Location patient: home Location provider: work Persons participating in the virtual visit: patient, provider.   I discussed the limitations, risks, security and privacy concerns of performing an evaluation and management service by telephone and the availability of in person appointments. I also discussed with the patient that there may be a patient responsible charge related to this service. The patient expressed understanding and verbally consented to this telephonic visit.    Interactive audio and video telecommunications were attempted between this provider and patient, however failed, due to patient having technical difficulties OR patient did not have access to video capability.  We continued and completed visit with audio only.  Some vital signs may be absent or patient reported.   Time Spent with patient on telephone encounter: 40 minutes  Subjective:   Olivia Mendoza is a 76 y.o. female who presents for Medicare Annual (Subsequent) preventive examination.  Review of Systems     Cardiac Risk Factors include: advanced age (>47men, >64 women);dyslipidemia;family history of premature cardiovascular disease     Objective:    There were no vitals filed for this visit. There is no height or weight on file to calculate BMI.  Advanced Directives 10/16/2021 10/12/2020  Does Patient Have a Medical Advance Directive? Yes Yes  Type of Advance Directive Living will;Healthcare Power of Attorney Living will;Healthcare Power of Attorney  Does patient want to make changes to medical advance directive? No - Patient declined No - Patient declined  Copy of [place] in Chart? No - copy requested No - copy requested    Current Medications (verified) Outpatient Encounter Medications as of 10/16/2021  Medication Sig   aspirin 81 MG EC tablet Take 81 mg by  mouth daily.   benzonatate (TESSALON) 200 MG capsule Take 1 capsule (200 mg total) by mouth 2 (two) times daily as needed for cough.   Cholecalciferol (VITAMIN D) 2000 UNITS CAPS Take by mouth daily.   Cyanocobalamin (B-12) 1000 MCG TABS Take by mouth.   estradiol (VIVELLE-DOT) 0.1 MG/24HR patch estradiol 0.1 mg/24 hr semiweekly transdermal patch  APPLY 1 PATCH TRANSDERMALLY TWICE A WEEK   famotidine (PEPCID) 20 MG tablet Take 20 mg by mouth daily.   Flaxseed, Linseed, (FLAX SEED OIL PO) Take 15 mLs by mouth daily.   fluticasone (FLONASE) 50 MCG/ACT nasal spray SPRAY 2 SPRAYS INTO EACH NOSTRIL EVERY DAY   loratadine (CLARITIN) 10 MG tablet Take 10 mg by mouth daily.   meclizine (ANTIVERT) 12.5 MG tablet Take 1 tablet (12.5 mg total) by mouth 3 (three) times daily as needed for dizziness.   polyethylene glycol (MIRALAX / GLYCOLAX) 17 g packet Take 17 g by mouth daily.   simvastatin (ZOCOR) 20 MG tablet TAKE 1 TABLET BY MOUTH EVERYDAY AT BEDTIME   Bioflavonoid Products (PERIDRIN-C) 200-50-150 MG TABS Take 2 tablets by mouth daily. (Patient not taking: Reported on 10/16/2021)   scopolamine (TRANSDERM-SCOP, 1.5 MG,) 1 MG/3DAYS Place 1 patch (1.5 mg total) onto the skin every 3 (three) days. (Patient not taking: Reported on 10/16/2021)   UNABLE TO FIND Take 1 tablet by mouth daily. Med Name:Mega D MK-7 5,000 units/180 mcg (Patient not taking: Reported on 10/16/2021)   [DISCONTINUED] alum & mag hydroxide-simeth (MAALOX/MYLANTA) 200-200-20 MG/5ML suspension Take 10 mLs by mouth at bedtime.   No facility-administered encounter medications on file as of 10/16/2021.    Allergies (verified) Morphine and  related and Penicillins   History: Past Medical History:  Diagnosis Date   Dysphagia    Fibrocystic breast disease    History of cystopexy    History of migraines    Hyperlipemia    IBS (irritable bowel syndrome)    Mononucleosis    MVP (mitral valve prolapse)    Positional vertigo    PVC (premature  ventricular contraction)    Routine general medical examination at a health care facility    Past Surgical History:  Procedure Laterality Date   BIOPSY THYROID     BREAST CYST ASPIRATION     LAPAROSCOPIC TUBAL LIGATION     Bilateral   VAGINAL HYSTERECTOMY  27   Family History  Problem Relation Age of Onset   Hypertension Mother    Hyperlipidemia Mother    Stroke Mother    Hiatal hernia Mother    Arthritis Mother    Heart disease Mother        PTVDP-ss syndrome (Dr Elisabeth Cara)   Hemochromatosis Father    Hyperlipidemia Father    Hypertension Father    Heart disease Father        CAD/CABG   Heart disease Brother        AAA Stent, PVD s/p stent, CAD, lipids   Breast cancer Maternal Grandmother 92   Social History   Socioeconomic History   Marital status: Divorced    Spouse name: Not on file   Number of children: Not on file   Years of education: Not on file   Highest education level: Not on file  Occupational History   Occupation: Music therapist  Tobacco Use   Smoking status: Never   Smokeless tobacco: Never  Substance and Sexual Activity   Alcohol use: Not on file   Drug use: Not on file   Sexual activity: Not on file  Other Topics Concern   Not on file  Social History Narrative   [employer], [employer].  Married 71yrs - divorced.  2 daughters, 1 son died 30 days after birth, 5 grandchildren   Live alone - independently, caregiver with mother. Work - continues to work for [employer].            Social Determinants of Health   Financial Resource Strain: Low Risk    Difficulty of Paying Living Expenses: Not hard at all  Food Insecurity: No Food Insecurity   Worried About Charity fundraiser in the Last Year: Never true   [place] in the Last Year: Never true  Transportation Needs: No Transportation Needs   Lack of Transportation (Medical): No   Lack of Transportation (Non-Medical): No  Physical Activity: Sufficiently Active   Days  of Exercise per Week: 5 days   Minutes of Exercise per Session: 30 min  Stress: No Stress Concern Present   Feeling of Stress : Not at all  Social Connections: Moderately Integrated   Frequency of Communication with Friends and Family: More than three times a week   Frequency of Social Gatherings with Friends and Family: More than three times a week   Attends Religious Services: More than 4 times per year   Active Member of [employer] or Organizations: Yes   Attends Archivist Meetings: More than 4 times per year   Marital Status: Widowed    Tobacco Counseling Counseling given: Not Answered   Clinical Intake:  Pre-visit preparation completed: No  Pain : No/denies pain     Nutritional Risks: None Diabetes:  No  How often do you need to have someone help you when you read instructions, pamphlets, or other written materials from your doctor or pharmacy?: 1 - Never What is the last grade level you completed in school?: 1 year of [employer]  Diabetic? no  Interpreter Needed?: No  Information entered by :: Lisette Abu, LPN   Activities of Daily Living In your present state of health, do you have any difficulty performing the following activities: 10/16/2021  Hearing? N  Vision? N  Difficulty concentrating or making decisions? N  Walking or climbing stairs? N  Dressing or bathing? N  Doing errands, shopping? N  Preparing Food and eating ? N  Using the Toilet? N  In the past six months, have you accidently leaked urine? Y  Do you have problems with loss of bowel control? N  Managing your Medications? N  Managing your Finances? N  Housekeeping or managing your Housekeeping? N  Some recent data might be hidden    Patient Care Team: Hoyt Koch, MD as PCP - General (Internal Medicine) Lomax, Marny Lowenstein, MD (Inactive) (Obstetrics and Gynecology) Inda Castle, MD (Inactive) (Gastroenterology) Katy Apo, MD as Consulting Physician  (Ophthalmology)  Indicate any recent Medical Services you may have received from other than [facility] providers in the past year (date may be approximate).     Assessment:   This is a routine wellness examination for Aalayah.  Hearing/Vision screen Hearing Screening - Comments:: Patient denied any hearing difficulty.   No hearing aids.  Vision Screening - Comments:: Patient wears corrective glasses/contacts.  Eye exam done annually by: Katy Apo, MD.  Dietary issues and exercise activities discussed: Current Exercise Habits: Home exercise routine, Time (Minutes): 30, Frequency (Times/Week): 5, Weekly Exercise (Minutes/Week): 150, Intensity: Moderate, Exercise limited by: None identified   Goals Addressed   None   Depression Screen PHQ 2/9 Scores 10/16/2021 10/12/2020 09/30/2019 09/23/2018 09/19/2017 08/28/2016 08/22/2015  PHQ - 2 Score 0 0 0 1 0 0 0    Fall Risk Fall Risk  10/16/2021 10/12/2020 09/30/2019 09/23/2018 09/19/2017  Falls in the past year? 0 0 0 0 No  Number falls in past yr: 0 0 - - -  Injury with Fall? 0 0 - - -  Risk for fall due to : No Fall Risks No Fall Risks - - -  Follow up Falls evaluation completed - - - -    FALL RISK PREVENTION PERTAINING TO THE HOME:  Any stairs in or around the home? Yes  If so, are there any without handrails? No  Home free of loose throw rugs in walkways, pet beds, electrical cords, etc? Yes  Adequate lighting in your home to reduce risk of falls? Yes   ASSISTIVE DEVICES UTILIZED TO PREVENT FALLS:  Life alert? No  Use of a cane, walker or w/c? No  Grab bars in the bathroom? Yes  Shower chair or bench in shower? Yes  Elevated toilet seat or a handicapped toilet? Yes   TIMED UP AND GO:  Was the test performed? No .  Length of time to ambulate 10 feet: n/a sec.   Gait steady and fast without use of assistive device  Cognitive Function: Normal cognitive status assessed by direct observation by this Nurse Health Advisor. No abnormalities  found.          Immunizations Immunization History  Administered Date(s) Administered   Fluad Quad(high Dose 65+) 06/20/2020   Influenza Whole 08/10/2009   Influenza, High Dose Seasonal  PF 08/08/2013, 06/23/2016, 06/16/2019, 07/12/2021   Influenza,inj,Quad PF,6+ Mos 06/17/2014, 06/11/2018   Influenza-Unspecified 07/04/2015, 06/28/2017, 06/16/2019   PFIZER(Purple Top)SARS-COV-2 Vaccination 11/09/2019, 12/08/2019, 06/25/2020   Pneumococcal Conjugate-13 08/22/2015   Pneumococcal Polysaccharide-23 12/20/2010   Tdap 12/20/2010   Zoster Recombinat (Shingrix) 12/23/2017, 05/22/2018   Zoster, Live 12/16/2007    TDAP status: Due, Education has been provided regarding the importance of this vaccine. Advised may receive this vaccine at local pharmacy or Health Dept. Aware to provide a copy of the vaccination record if obtained from local pharmacy or Health Dept. Verbalized acceptance and understanding.  Flu Vaccine status: Up to date  Pneumococcal vaccine status: Up to date  Covid-19 vaccine status: Completed vaccines  Qualifies for Shingles Vaccine? Yes   Zostavax completed Yes   Shingrix Completed?: Yes  Screening Tests Health Maintenance  Topic Date Due   COVID-19 Vaccine (4 - Booster for Pfizer series) 08/20/2020   TETANUS/TDAP  12/19/2020   Pneumonia Vaccine 60+ Years old  Completed   INFLUENZA VACCINE  Completed   DEXA SCAN  Completed   Hepatitis C Screening  Completed   Zoster Vaccines- Shingrix  Completed   HPV VACCINES  Aged Out   COLONOSCOPY (Pts 45-38yrs Insurance coverage will need to be confirmed)  [place] Maintenance Due  Topic Date Due   COVID-19 Vaccine (4 - Booster for [place] series) 08/20/2020   TETANUS/TDAP  12/19/2020    Colorectal cancer screening: No longer required.   Mammogram status: Completed 01/31/2021. Repeat every year  Bone Density status: last done 09/11/2011  Lung Cancer Screening: (Low Dose CT Chest  recommended if Age 29-80 years, 30 pack-year currently smoking OR have quit w/in 15years.) does not qualify.   Lung Cancer Screening Referral: no  Additional Screening:  Hepatitis C Screening: does qualify; Completed yes  Vision Screening: Recommended annual ophthalmology exams for early detection of glaucoma and other disorders of the eye. Is the patient up to date with their annual eye exam?  Yes  Who is the provider or what is the name of the office in which the patient attends annual eye exams? Katy Apo, MD. If pt is not established with a provider, would they like to be referred to a provider to establish care? No .   Dental Screening: Recommended annual dental exams for proper oral hygiene  Community Resource Referral / Chronic Care Management: CRR required this visit?  No   CCM required this visit?  No      Plan:     I have personally reviewed and noted the following in the patients chart:   Medical and social history Use of alcohol, tobacco or illicit drugs  Current medications and supplements including opioid prescriptions.  Functional ability and status Nutritional status Physical activity Advanced directives List of other physicians Hospitalizations, surgeries, and ER visits in previous 12 months Vitals Screenings to include cognitive, depression, and falls Referrals and appointments  In addition, I have reviewed and discussed with patient certain preventive protocols, quality metrics, and best practice recommendations. A written personalized care plan for preventive services as well as general preventive health recommendations were provided to patient.     Sheral Flow, LPN   03/12/7105   Nurse Notes:  Patient is cogitatively intact. There were no vitals filed for this visit. There is no height or weight on file to calculate BMI. Patient stated that she has no issues with gait or balance; does not use any assistive devices. Medications reviewed  with patient; no opioid use noted.

## 2021-10-19 ENCOUNTER — Other Ambulatory Visit: Payer: Self-pay

## 2021-10-19 ENCOUNTER — Encounter: Payer: Self-pay | Admitting: Internal Medicine

## 2021-10-19 ENCOUNTER — Ambulatory Visit (INDEPENDENT_AMBULATORY_CARE_PROVIDER_SITE_OTHER): Payer: Medicare Other | Admitting: Internal Medicine

## 2021-10-19 VITALS — BP 122/80 | HR 89 | Resp 18 | Ht 67.0 in | Wt 194.8 lb

## 2021-10-19 DIAGNOSIS — Z780 Asymptomatic menopausal state: Secondary | ICD-10-CM | POA: Diagnosis not present

## 2021-10-19 DIAGNOSIS — K589 Irritable bowel syndrome without diarrhea: Secondary | ICD-10-CM

## 2021-10-19 DIAGNOSIS — J3089 Other allergic rhinitis: Secondary | ICD-10-CM | POA: Diagnosis not present

## 2021-10-19 DIAGNOSIS — E785 Hyperlipidemia, unspecified: Secondary | ICD-10-CM

## 2021-10-19 DIAGNOSIS — H8110 Benign paroxysmal vertigo, unspecified ear: Secondary | ICD-10-CM | POA: Diagnosis not present

## 2021-10-19 DIAGNOSIS — Z0000 Encounter for general adult medical examination without abnormal findings: Secondary | ICD-10-CM

## 2021-10-19 DIAGNOSIS — K219 Gastro-esophageal reflux disease without esophagitis: Secondary | ICD-10-CM | POA: Diagnosis not present

## 2021-10-19 DIAGNOSIS — R7301 Impaired fasting glucose: Secondary | ICD-10-CM | POA: Diagnosis not present

## 2021-10-19 LAB — COMPREHENSIVE METABOLIC PANEL
ALT: 20 U/L (ref 0–35)
AST: 22 U/L (ref 0–37)
Albumin: 4.3 g/dL (ref 3.5–5.2)
Alkaline Phosphatase: 80 U/L (ref 39–117)
BUN: 10 mg/dL (ref 6–23)
CO2: 30 mEq/L (ref 19–32)
Calcium: 9.6 mg/dL (ref 8.4–10.5)
Chloride: 102 mEq/L (ref 96–112)
Creatinine, Ser: 0.81 mg/dL (ref 0.40–1.20)
GFR: 70.47 mL/min (ref >60.00)
Glucose, Bld: 98 mg/dL (ref 70–99)
Potassium: 4.9 mEq/L (ref 3.5–5.1)
Sodium: 137 mEq/L (ref 135–145)
Total Bilirubin: 1.9 mg/dL — ABNORMAL HIGH (ref 0.2–1.2)
Total Protein: 7.6 g/dL (ref 6.0–8.3)

## 2021-10-19 LAB — CBC
HCT: 45.3 % (ref 36.0–46.0)
Hemoglobin: 15.2 g/dL — ABNORMAL HIGH (ref 12.0–15.0)
MCHC: 33.5 g/dL (ref 30.0–36.0)
MCV: 88.3 fl (ref 78.0–100.0)
Platelets: 355 10*3/uL (ref 150.0–400.0)
RBC: 5.12 Mil/uL — ABNORMAL HIGH (ref 3.87–5.11)
RDW: 13.3 % (ref 11.5–15.5)
WBC: 5.2 10*3/uL (ref 4.0–10.5)

## 2021-10-19 LAB — LIPID PANEL
Cholesterol: 201 mg/dL — ABNORMAL HIGH (ref 0–200)
HDL: 56.6 mg/dL (ref >39.00)
NonHDL: 144.33
Total CHOL/HDL Ratio: 4
Triglycerides: 315 mg/dL — ABNORMAL HIGH (ref 0.0–149.0)
VLDL: 63 mg/dL — ABNORMAL HIGH (ref 0.0–40.0)

## 2021-10-19 LAB — HEMOGLOBIN A1C: Hgb A1c MFr Bld: 5.5 % (ref 4.6–6.5)

## 2021-10-19 LAB — LDL CHOLESTEROL, DIRECT: Direct LDL: 118 mg/dL

## 2021-10-19 MED ORDER — FLUTICASONE PROPIONATE 50 MCG/ACT NA SUSP: 2.0000 | Freq: Every day | NASAL | 1 refills | Status: DC

## 2021-10-19 MED ORDER — MECLIZINE HCL 12.5 MG PO TABS: 12.5000 mg | ORAL_TABLET | Freq: Three times a day (TID) | ORAL | 3 refills | Status: DC | PRN

## 2021-10-19 MED ORDER — BENZONATATE 200 MG PO CAPS: 200.0000 mg | ORAL_CAPSULE | Freq: Two times a day (BID) | ORAL | 3 refills | Status: DC | PRN

## 2021-10-19 NOTE — Patient Instructions (Signed)
Think about a tetanus booster at the pharmacy.

## 2021-10-19 NOTE — Progress Notes (Signed)
° °  Subjective:   Patient ID: Olivia Mendoza, female    DOB: 04-09-1946, 76 y.o.   MRN: [id number]  HPI The patient is a 76 YO female coming in for follow up.  Review of Systems  Constitutional: Negative.   HENT: Negative.    Eyes: Negative.   Respiratory:  Negative for cough, chest tightness and shortness of breath.   Cardiovascular:  Negative for chest pain, palpitations and leg swelling.  Gastrointestinal:  Negative for abdominal distention, abdominal pain, constipation, diarrhea, nausea and vomiting.  Musculoskeletal: Negative.   Skin: Negative.   Neurological: Negative.   Psychiatric/Behavioral: Negative.     Objective:  Physical Exam Constitutional:      Appearance: She is well-developed.  HENT:     Head: Normocephalic and atraumatic.  Cardiovascular:     Rate and Rhythm: Normal rate and regular rhythm.  Pulmonary:     Effort: Pulmonary effort is normal. No respiratory distress.     Breath sounds: Normal breath sounds. No wheezing or rales.  Abdominal:     General: Bowel sounds are normal. There is no distension.     Palpations: Abdomen is soft.     Tenderness: There is no abdominal tenderness. There is no rebound.  Musculoskeletal:     Cervical back: Normal range of motion.  Skin:    General: Skin is warm and dry.  Neurological:     Mental Status: She is alert and oriented to person, place, and time.     Coordination: Coordination normal.    Vitals:   10/19/21 0952  BP: 122/80  Pulse: 89  Resp: 18  SpO2: 97%  Weight: 194 lb 12.8 oz (88.4 kg)  Height: 5\' 7"  (1.702 m)    This visit occurred during the SARS-CoV-2 public health emergency.  Safety protocols were in place, including screening questions prior to the visit, additional usage of staff PPE, and extensive cleaning of exam room while observing appropriate contact time as indicated for disinfecting solutions.   Assessment & Plan:

## 2021-10-20 DIAGNOSIS — Z780 Asymptomatic menopausal state: Secondary | ICD-10-CM | POA: Insufficient documentation

## 2021-10-20 DIAGNOSIS — K219 Gastro-esophageal reflux disease without esophagitis: Secondary | ICD-10-CM | POA: Insufficient documentation

## 2021-10-20 NOTE — Assessment & Plan Note (Signed)
Uses meclizine prn and can refill if needed. No current flare.

## 2021-10-20 NOTE — Assessment & Plan Note (Signed)
Doing well with estrogen patch and if/when her ob/gyn retires we will take over this rx.

## 2021-10-20 NOTE — Assessment & Plan Note (Addendum)
Doing well with pepcid. Some cough associated so refill tessalon perles when needed.

## 2021-10-20 NOTE — Assessment & Plan Note (Signed)
Stable with dietary avoidance.

## 2021-10-20 NOTE — Assessment & Plan Note (Signed)
Checking lipid panel and adjust as needed. She is on simvastatin 20 mg daily and got side effects at 40 mg daily dosing. If triglycerides still elevated will plan to start zetia as well.

## 2021-10-23 ENCOUNTER — Other Ambulatory Visit: Payer: Self-pay | Admitting: Internal Medicine

## 2021-10-23 ENCOUNTER — Other Ambulatory Visit: Payer: Self-pay | Admitting: Obstetrics and Gynecology

## 2021-10-23 DIAGNOSIS — Z1231 Encounter for screening mammogram for malignant neoplasm of breast: Secondary | ICD-10-CM

## 2021-10-23 MED ORDER — EZETIMIBE 10 MG PO TABS: 10.0000 mg | ORAL_TABLET | Freq: Every day | ORAL | 3 refills | Status: DC

## 2021-10-31 ENCOUNTER — Telehealth: Payer: Self-pay | Admitting: Internal Medicine

## 2021-10-31 NOTE — Telephone Encounter (Signed)
Pt states she received "a call last week and want to know why"  I was unable to locate notation of an outgoing call, pt states a vm was not left  Pt requesting a hard copy of 10-19-2021 lab results mailed  Pt requesting a c/b regarding missed call

## 2021-10-31 NOTE — Telephone Encounter (Signed)
DVM left with normal results.

## 2022-01-29 DIAGNOSIS — H524 Presbyopia: Secondary | ICD-10-CM | POA: Diagnosis not present

## 2022-01-29 DIAGNOSIS — H2513 Age-related nuclear cataract, bilateral: Secondary | ICD-10-CM | POA: Diagnosis not present

## 2022-01-31 DIAGNOSIS — Z6830 Body mass index (BMI) 30.0-30.9, adult: Secondary | ICD-10-CM | POA: Diagnosis not present

## 2022-01-31 DIAGNOSIS — N959 Unspecified menopausal and perimenopausal disorder: Secondary | ICD-10-CM | POA: Diagnosis not present

## 2022-01-31 DIAGNOSIS — Z01419 Encounter for gynecological examination (general) (routine) without abnormal findings: Secondary | ICD-10-CM | POA: Diagnosis not present

## 2022-02-06 ENCOUNTER — Ambulatory Visit
Admission: RE | Admit: 2022-02-06 | Discharge: 2022-02-06 | Disposition: A | Discharge status: Home / Self Care | Payer: Medicare Other | Source: Ambulatory Visit | Attending: Obstetrics and Gynecology | Admitting: Obstetrics and Gynecology

## 2022-02-06 DIAGNOSIS — Z1231 Encounter for screening mammogram for malignant neoplasm of breast: Secondary | ICD-10-CM

## 2022-02-08 ENCOUNTER — Other Ambulatory Visit: Payer: Self-pay | Admitting: Obstetrics and Gynecology

## 2022-02-08 DIAGNOSIS — N6452 Nipple discharge: Secondary | ICD-10-CM

## 2022-02-13 ENCOUNTER — Other Ambulatory Visit: Payer: Self-pay | Admitting: Obstetrics and Gynecology

## 2022-02-13 ENCOUNTER — Ambulatory Visit
Admission: RE | Admit: 2022-02-13 | Discharge: 2022-02-13 | Disposition: A | Discharge status: Home / Self Care | Payer: Medicare Other | Source: Ambulatory Visit | Attending: Obstetrics and Gynecology | Admitting: Obstetrics and Gynecology

## 2022-02-13 DIAGNOSIS — N6452 Nipple discharge: Secondary | ICD-10-CM

## 2022-02-15 ENCOUNTER — Ambulatory Visit
Admission: RE | Admit: 2022-02-15 | Discharge: 2022-02-15 | Disposition: A | Discharge status: Home / Self Care | Payer: Medicare Other | Source: Ambulatory Visit | Attending: Obstetrics and Gynecology | Admitting: Obstetrics and Gynecology

## 2022-02-15 DIAGNOSIS — N6452 Nipple discharge: Secondary | ICD-10-CM

## 2022-02-15 DIAGNOSIS — D242 Benign neoplasm of left breast: Secondary | ICD-10-CM | POA: Diagnosis not present

## 2022-02-15 DIAGNOSIS — N6325 Unspecified lump in the left breast, overlapping quadrants: Secondary | ICD-10-CM | POA: Diagnosis not present

## 2022-03-12 ENCOUNTER — Ambulatory Visit: Payer: Self-pay | Admitting: Surgery

## 2022-03-12 DIAGNOSIS — D242 Benign neoplasm of left breast: Secondary | ICD-10-CM | POA: Diagnosis not present

## 2022-03-20 ENCOUNTER — Other Ambulatory Visit: Payer: Self-pay | Admitting: Surgery

## 2022-03-20 DIAGNOSIS — D242 Benign neoplasm of left breast: Secondary | ICD-10-CM

## 2022-04-16 ENCOUNTER — Encounter (HOSPITAL_BASED_OUTPATIENT_CLINIC_OR_DEPARTMENT_OTHER): Payer: Self-pay | Admitting: Surgery

## 2022-04-20 ENCOUNTER — Encounter (HOSPITAL_BASED_OUTPATIENT_CLINIC_OR_DEPARTMENT_OTHER)
Admission: RE | Admit: 2022-04-20 | Discharge: 2022-04-20 | Disposition: A | Discharge status: Home / Self Care | Payer: Medicare Other | Source: Ambulatory Visit | Attending: Surgery | Admitting: Surgery

## 2022-04-20 ENCOUNTER — Encounter: Payer: Self-pay | Admitting: Interventional Cardiology

## 2022-04-20 ENCOUNTER — Ambulatory Visit (INDEPENDENT_AMBULATORY_CARE_PROVIDER_SITE_OTHER): Payer: Medicare Other | Admitting: Interventional Cardiology

## 2022-04-20 VITALS — BP 164/72 | HR 73 | Ht 66.5 in | Wt 193.0 lb

## 2022-04-20 DIAGNOSIS — R9431 Abnormal electrocardiogram [ECG] [EKG]: Secondary | ICD-10-CM | POA: Diagnosis not present

## 2022-04-20 DIAGNOSIS — I340 Nonrheumatic mitral (valve) insufficiency: Secondary | ICD-10-CM

## 2022-04-20 DIAGNOSIS — Z01810 Encounter for preprocedural cardiovascular examination: Secondary | ICD-10-CM | POA: Diagnosis not present

## 2022-04-20 DIAGNOSIS — Z01812 Encounter for preprocedural laboratory examination: Secondary | ICD-10-CM | POA: Insufficient documentation

## 2022-04-20 DIAGNOSIS — I341 Nonrheumatic mitral (valve) prolapse: Secondary | ICD-10-CM

## 2022-04-20 DIAGNOSIS — I4891 Unspecified atrial fibrillation: Secondary | ICD-10-CM

## 2022-04-20 MED ORDER — APIXABAN 5 MG PO TABS: 5.0000 mg | ORAL_TABLET | Freq: Two times a day (BID) | ORAL | 6 refills | Status: DC

## 2022-04-20 MED ORDER — METOPROLOL TARTRATE 25 MG PO TABS: 25.0000 mg | ORAL_TABLET | Freq: Two times a day (BID) | ORAL | 2 refills | Status: DC

## 2022-04-20 NOTE — Progress Notes (Addendum)
EKG reveals new onset of Atrial Fibrillation per Dr.Germeroth. Patient will need cardiac clearance prior to surgery. Left message at [facility].  Spoke with Claiborne Billings from [facility] and they are aware of situation.

## 2022-04-20 NOTE — Progress Notes (Signed)

## 2022-04-20 NOTE — Patient Instructions (Addendum)
Medication Instructions:  Your physician has recommended you make the following change in your medication:  Start Eliquis 5 mg by mouth twice daily. Start metoprolol tartrate 25 mg by mouth twice daily  Stop aspirin  *If you need a refill on your cardiac medications before your next appointment, please call your pharmacy*   Lab Work: none If you have labs (blood work) drawn today and your tests are completely normal, you will receive your results only by: [place] (if you have MyChart) OR A paper copy in the mail If you have any lab test that is abnormal or we need to change your treatment, we will call you to review the results.   Testing/Procedures: Your physician has requested that you have an echocardiogram. Echocardiography is a painless test that uses sound waves to create images of your heart. It provides your doctor with information about the size and shape of your heart and how well your heart's chambers and valves are working. This procedure takes approximately one hour. There are no restrictions for this procedure.    Follow-Up: At [facility], you and your health needs are our priority.  As part of our continuing mission to provide you with exceptional heart care, we have created designated Provider Care Teams.  These Care Teams include your primary Cardiologist (physician) and Advanced Practice Providers (APPs -  Physician Assistants and Nurse Practitioners) who all work together to provide you with the care you need, when you need it.  We recommend signing up for the patient portal called "MyChart".  Sign up information is provided on this After Visit Summary.  MyChart is used to connect with patients for Virtual Visits (Telemedicine).  Patients are able to view lab/test results, encounter notes, upcoming appointments, etc.  Non-urgent messages can be sent to your provider as well.   To learn more about what you can do with MyChart, go to [email or web address].     Your next appointment:   4 week(s)  The format for your next appointment:   In Person  Provider:  Dr Irish Lack or APP     Other Instructions    Important Information About Sugar

## 2022-04-20 NOTE — Progress Notes (Signed)
Cardiology Office Note   Date:  04/20/2022   ID:  Olivia Mendoza, DOB 08-21-1946, MRN [id number]  PCP:  Myrlene Broker, MD    No chief complaint on file.  Preoperative cardiovascular exam  Wt Readings from Last 3 Encounters:  04/20/22 193 lb (87.5 kg)  10/19/21 194 lb 12.8 oz (88.4 kg)  10/18/20 187 lb 9.6 oz (85.1 kg)       History of Present Illness: Olivia Mendoza is a 76 y.o. female who is being seen today for the evaluation of preoperative cardiovascular exam at the request of Myrlene Broker, *.   She had mitral valve prolapse.  She had prior echos showing mild MR per her report.   She needs lumpectomy surgery for breast papilloma.  Preop ECG showed AFib with controlled VR today.  She recalls palpitations in 9/21.  She stopped caffeine.  Sx resolved.    Brother is a smoker and had AAA and multiple coronary stents.    Today, she went for preop ECG before her planned breast surgery.  ECG showed she was in atrial fibrillation with borderline rate control.  She was asymptomatic.  She has had symptomatic PVCs in the past.  Denies : Chest pain. Dizziness. Leg edema. Nitroglycerin use. Orthopnea. Paroxysmal nocturnal dyspnea. Shortness of breath. Syncope.      Past Medical History:  Diagnosis Date   Dysphagia    Fibrocystic breast disease    GERD (gastroesophageal reflux disease)    History of cystopexy    History of migraines    Hyperlipemia    IBS (irritable bowel syndrome)    Mononucleosis    MVP (mitral valve prolapse)    Positional vertigo    PVC (premature ventricular contraction)    Routine general medical examination at a health care facility     Past Surgical History:  Procedure Laterality Date   BIOPSY THYROID     BREAST CYST ASPIRATION     LAPAROSCOPIC TUBAL LIGATION     Bilateral   VAGINAL HYSTERECTOMY  92     Current Outpatient Medications  Medication Sig Dispense Refill   apixaban (ELIQUIS) 5 MG TABS tablet Take 1 tablet (5 mg  total) by mouth 2 (two) times daily. 60 tablet 6   Cyanocobalamin (B-12) 1000 MCG TABS Take by mouth.     estradiol (VIVELLE-DOT) 0.1 MG/24HR patch estradiol 0.1 mg/24 hr semiweekly transdermal patch  APPLY 1 PATCH TRANSDERMALLY TWICE A WEEK     famotidine (PEPCID) 20 MG tablet Take 20 mg by mouth daily.     Flaxseed, Linseed, (FLAX SEED OIL PO) Take 15 mLs by mouth daily.     fluticasone (FLONASE) 50 MCG/ACT nasal spray Place 2 sprays into both nostrils daily as needed for allergies or rhinitis.     loratadine (CLARITIN) 10 MG tablet Take 10 mg by mouth daily.     meclizine (ANTIVERT) 12.5 MG tablet Take 1 tablet (12.5 mg total) by mouth 3 (three) times daily as needed for dizziness. 30 tablet 3   metoprolol tartrate (LOPRESSOR) 25 MG tablet Take 1 tablet (25 mg total) by mouth 2 (two) times daily. 180 tablet 2   polyethylene glycol (MIRALAX / GLYCOLAX) 17 g packet Take 17 g by mouth daily.     scopolamine (TRANSDERM-SCOP) 1 MG/3DAYS Place 1 patch onto the skin every 3 (three) days as needed (Nausea).     simvastatin (ZOCOR) 20 MG tablet TAKE 1 TABLET BY MOUTH EVERYDAY AT BEDTIME 90 tablet 3   No  current facility-administered medications for this visit.    Allergies:   Morphine and related, Penicillins, and Zetia [ezetimibe]    Social History:  The patient  reports that she has never smoked. She has never used smokeless tobacco. She reports that she does not drink alcohol and does not use drugs.   Family History:  The patient's family history includes Arthritis in her mother; Breast cancer (age of onset: 66) in her maternal grandmother; Heart disease in her brother, father, and mother; Hemochromatosis in her father; Hiatal hernia in her mother; Hyperlipidemia in her father and mother; Hypertension in her father and mother; Stroke in her mother.    ROS:  Please see the history of present illness.   Otherwise, review of systems are positive for need for lumpectomy.   All other systems are  reviewed and negative.    PHYSICAL EXAM: VS:  BP (!) 164/72   Pulse 73   Ht 5' 6.5" (1.689 m)   Wt 193 lb (87.5 kg)   SpO2 97%   BMI 30.68 kg/m  , BMI Body mass index is 30.68 kg/m. GEN: Well nourished, well developed, in no acute distress HEENT: normal Neck: no JVD, carotid bruits, or masses Cardiac: Irregularly irregular; 3/6 holosystolic murmur, no rubs, or gallops,no edema  Respiratory:  clear to auscultation bilaterally, normal work of breathing GI: soft, nontender, nondistended, + BS MS: no deformity or atrophy Skin: warm and dry, no rash Neuro:  Strength and sensation are intact Psych: euthymic mood, full affect   EKG:   The ekg ordered today demonstrates atrial fibrillation with borderline ventricular response   Recent Labs: 10/19/2021: ALT 20; BUN 10; Creatinine, Ser 0.81; Hemoglobin 15.2; Platelets 355.0; Potassium 4.9; Sodium 137   Lipid Panel    Component Value Date/Time   CHOL 201 (H) 10/19/2021 1025   TRIG 315.0 (H) 10/19/2021 1025   HDL 56.60 10/19/2021 1025   CHOLHDL 4 10/19/2021 1025   VLDL 63.0 (H) 10/19/2021 1025   LDLCALC 125 (H) 08/25/2015 1033   LDLDIRECT 118.0 10/19/2021 1025     Other studies Reviewed: Additional studies/ records that were reviewed today with results demonstrating: Labs reviewed, normal renal function in February 2023, total cholesterol 201, HDL 56.   ASSESSMENT AND PLAN:  Preoperative cardiovascular exam: Needs elective lumpectomy for noncancerous papilloma.  Would postpone surgery given atrial fibrillation of unclear onset.  Significant murmur noted on exam.  She does have a long history of mitral valve prolapse.  Want to make sure there is not severe valvular regurgitation prior to elective surgery. Atrial fibrillation: Start metoprolol 25 mg p.o. twice daily.  Start Eliquis 5 mg p.o. twice daily for stroke prevention.  She will need to be met, CBC and a TSH when she comes back for her echocardiogram.  Depending on echo  result, could consider cardioversion to try to restore normal sinus rhythm. Mitral regurgitation: Check echordiogram.  Significant murmur noted on exam.  No obvious congestive heart failure symptoms.   Current medicines are reviewed at length with the patient today.  The patient concerns regarding her medicines were addressed.  The following changes have been made: As above  Labs/ tests ordered today include:   Orders Placed This Encounter  Procedures   ECHOCARDIOGRAM COMPLETE    Recommend 150 minutes/week of aerobic exercise Low fat, low carb, high fiber diet recommended  Disposition:   FU in 4 weeks   Signed, Larae Grooms, MD  04/20/2022 4:19 PM    [facility]  [address] [place]  [address] Phone: [phone]; Fax: [phone]

## 2022-04-21 MED ORDER — PROPOFOL 1000 MG/100ML IV EMUL
INTRAVENOUS | Status: AC
  Filled 2022-04-21: qty 400

## 2022-04-24 ENCOUNTER — Ambulatory Visit (HOSPITAL_BASED_OUTPATIENT_CLINIC_OR_DEPARTMENT_OTHER): Admission: RE | Admit: 2022-04-24 | Payer: Medicare Other | Source: Ambulatory Visit | Admitting: Surgery

## 2022-04-24 DIAGNOSIS — D242 Benign neoplasm of left breast: Secondary | ICD-10-CM

## 2022-04-24 DIAGNOSIS — Z01818 Encounter for other preprocedural examination: Secondary | ICD-10-CM

## 2022-04-24 HISTORY — DX: Gastro-esophageal reflux disease without esophagitis: K21.9

## 2022-04-24 SURGERY — BREAST LUMPECTOMY WITH RADIOACTIVE SEED LOCALIZATION
Anesthesia: General | Site: Breast | Laterality: Left

## 2022-05-01 ENCOUNTER — Other Ambulatory Visit: Payer: Medicare Other

## 2022-05-01 ENCOUNTER — Ambulatory Visit (HOSPITAL_COMMUNITY): Payer: Medicare Other | Attending: Cardiology

## 2022-05-01 ENCOUNTER — Telehealth: Payer: Self-pay | Admitting: *Deleted

## 2022-05-01 DIAGNOSIS — I4891 Unspecified atrial fibrillation: Secondary | ICD-10-CM

## 2022-05-01 DIAGNOSIS — I341 Nonrheumatic mitral (valve) prolapse: Secondary | ICD-10-CM | POA: Diagnosis not present

## 2022-05-01 DIAGNOSIS — I340 Nonrheumatic mitral (valve) insufficiency: Secondary | ICD-10-CM

## 2022-05-01 LAB — ECHOCARDIOGRAM COMPLETE
MV M vel: 5.96 m/s
MV Peak grad: 142.1 mmHg
P 1/2 time: 437 msec
S' Lateral: 2.8 cm

## 2022-05-01 NOTE — Telephone Encounter (Signed)
Labs ordered. I spoke with patient and let her know labs would be done today when she was here for echo.

## 2022-05-01 NOTE — Telephone Encounter (Signed)
-----   Message from [name], MD sent at 04/20/2022  5:37 PM EDT ----- Olivia Mendoza,She will need CBC, Bmet and TSH when she comes back for her echo. Thanks.  Dr. Brantley Stage, I think it is best to postpone her surgery since it is elective.  I am a little concerned that her mitral regurgtatio may be worse than prior which caused the AFib.  JV

## 2022-05-02 LAB — CBC
Hematocrit: 47 % — ABNORMAL HIGH (ref 34.0–46.6)
Hemoglobin: 16.3 g/dL — ABNORMAL HIGH (ref 11.1–15.9)
MCH: 30.6 pg (ref 26.6–33.0)
MCHC: 34.7 g/dL (ref 31.5–35.7)
MCV: 88 fL (ref 79–97)
Platelets: 355 10*3/uL (ref 150–450)
RBC: 5.32 x10E6/uL — ABNORMAL HIGH (ref 3.77–5.28)
RDW: 13.2 % (ref 11.7–15.4)
WBC: 7.3 10*3/uL (ref 3.4–10.8)

## 2022-05-02 LAB — BASIC METABOLIC PANEL
BUN/Creatinine Ratio: 12 (ref 12–28)
BUN: 11 mg/dL (ref 8–27)
CO2: 20 mmol/L (ref 20–29)
Calcium: 9.8 mg/dL (ref 8.7–10.3)
Chloride: 99 mmol/L (ref 96–106)
Creatinine, Ser: 0.9 mg/dL (ref 0.57–1.00)
Glucose: 114 mg/dL — ABNORMAL HIGH (ref 70–99)
Potassium: 4.2 mmol/L (ref 3.5–5.2)
Sodium: 140 mmol/L (ref 134–144)
eGFR: 66 mL/min/{1.73_m2} (ref >59)

## 2022-05-02 LAB — TSH: TSH: 3.24 u[IU]/mL (ref 0.450–4.500)

## 2022-05-03 ENCOUNTER — Telehealth: Payer: Self-pay

## 2022-05-03 NOTE — Telephone Encounter (Signed)
Called patient and discussed lab results.  Per Dr. Irish Lack: Normal CBC, BMet and TSH    Patient expressed appreciation for call. Patient states she does not qualify for patient assistance for Eliquis and cannot afford $540 per month. She would like to discuss this with Dr. Hassell Done nurse when she is back in the office to discuss alternatives.  Also reviewed results of echocardiogram with patient and she states she will need clearance for lumpectomy from Dr. Irish Lack.

## 2022-05-04 ENCOUNTER — Telehealth: Payer: Self-pay | Admitting: *Deleted

## 2022-05-04 NOTE — Telephone Encounter (Signed)
Previous phone note indicates patient needs clearance.  Note below is from echo results.  Will forward to pre op pool Patient has follow up appointment with Ambrose Pancoast, NP on 05/18/22

## 2022-05-04 NOTE — Telephone Encounter (Signed)
I spoke with patient.  She is asking if she can be changed to Plavix due to cost of Eliquis.  I explained Plavix works differently than Eliquis and would not be appropriate for her.  Patient has not contacted assistance program but she feels she will not qualify for assistance. She is willing to contact Eliquis assistance to see if she may qualify. Patient has phone number to contact assistance program.  Patient aware samples can be provided while waiting to see if she qualifies for assistance.  Patient currently has 5-6 weeks of Eliquis at home

## 2022-05-04 NOTE — Telephone Encounter (Signed)
Will forward to [place] to check on possible assistance. Phone note started with echo results and routed to pre op pool

## 2022-05-04 NOTE — Telephone Encounter (Signed)
-----   Message from [name], MD sent at 05/01/2022  1:02 PM EDT ----- Normal LV/RV function.  Mild to moderate mitral regurgitation.  Moderate aortic regurgitation.  She will annual follow-up in the office for valvular disease.  Okay to proceed with surgery at this point.  She will need to hold Eliquis 2 days prior.  After surgery, we can restart her Eliquis and then potentially look into elective cardioversion 3 weeks after the start of Eliquis.

## 2022-05-04 NOTE — Telephone Encounter (Signed)
   Patient Name: Olivia Mendoza  DOB: Nov 30, 1945 MRN: [id number]  Primary Cardiologist: Larae Grooms, MD  Chart reviewed as part of pre-operative protocol coverage. Pre-op clearance already addressed by colleagues in earlier phone notes. To summarize recommendations:  ------ Message from [name], MD sent at 05/01/2022  1:02 PM EDT Normal LV/RV function.  Mild to moderate mitral regurgitation.  Moderate aortic regurgitation.  She will annual follow-up in the office for valvular disease.  Okay to proceed with surgery at this point.  She will need to hold Eliquis 2 days prior.  After surgery, we can restart her Eliquis and then potentially look into elective cardioversion 3 weeks after the start of Eliquis.  Will route this bundled recommendation to requesting provider via Epic fax function and remove from pre-op pool. Please call with questions.  Elgie Collard, PA-C 05/04/2022, 8:57 AM

## 2022-05-15 NOTE — Progress Notes (Addendum)
Office Visit    Patient Name: Olivia Mendoza Date of Encounter: 05/18/2022  Primary Care Provider:  Hoyt Koch, MD Primary Cardiologist:  Larae Grooms, MD Primary Electrophysiologist: None  Chief Complaint    Olivia Mendoza is a 76 y.o. female with PMH of HLD, aortic stenosis, paroxysmal AF, PVCs, MVP, who presents today for 4 to 6-week follow-up of atrial fibrillation.  Past Medical History    Past Medical History:  Diagnosis Date   Dysphagia    Fibrocystic breast disease    GERD (gastroesophageal reflux disease)    History of cystopexy    History of migraines    Hyperlipemia    IBS (irritable bowel syndrome)    Mononucleosis    MVP (mitral valve prolapse)    Positional vertigo    PVC (premature ventricular contraction)    Routine general medical examination at a health care facility    Past Surgical History:  Procedure Laterality Date   BIOPSY THYROID     BREAST CYST ASPIRATION     LAPAROSCOPIC TUBAL LIGATION     Bilateral   VAGINAL HYSTERECTOMY  92    Allergies  Allergies  Allergen Reactions   Morphine And Related Hives and Nausea And Vomiting   Penicillins    Zetia [Ezetimibe]     Made IBS worse    History of Present Illness    Olivia Mendoza is a 76 year old female with the above-mentioned past medical history who presents today for follow-up of atrial fibrillation.  Olivia Mendoza was last seen by Dr. Irish Lack on 04/20/2022 for preoperative clearance.  She was found to have new onset atrial fibrillation and surgery was postponed and patient was started on Eliquis 5 mg and metoprolol for rate control.  2D echo was ordered and showed normal LV RV function with mild to moderate MR regurg and no aortic stenosis.  She was recommended to follow-up annually for echo for monitoring of valve disease.  She was able to reschedule surgery following echo results and was instructed to hold Eliquis for 2 days prior to procedure.  Plan is to restart Eliquis and  potentially have elective cardioversion following 3 weeks.  [name] presents today for follow-up alone.  Since last being seen in the office patient reports that she has been doing well and notices when she is in atrial fibrillation.  She does not have any chest pain but does endorse shortness of breath when this occurs.  She is tolerating her Lopressor without any side effects.  EKG was obtained today for verification and patient is still in atrial fibrillation with a rate of 88.  We discussed the pathophysiology of atrial fibrillation and the treatment plans moving forward including DCCV.  She has siblings and family numbers that also have had atrial fibrillation and is well well aware of what to expect regarding neck steps.  Patient denies chest pain, palpitations,  PND, orthopnea, nausea, vomiting, dizziness, syncope, edema, weight gain, or early satiety.  Home Medications    Current Outpatient Medications  Medication Sig Dispense Refill   apixaban (ELIQUIS) 5 MG TABS tablet Take 1 tablet (5 mg total) by mouth 2 (two) times daily. 60 tablet 6   Cholecalciferol (VITAMIN D) 50 MCG (2000 UT) CAPS Take 2,000 Units by mouth daily.     Cyanocobalamin (B-12) 1000 MCG TABS Take by mouth.     estradiol (VIVELLE-DOT) 0.1 MG/24HR patch estradiol 0.1 mg/24 hr semiweekly transdermal patch  APPLY 1 PATCH TRANSDERMALLY TWICE A WEEK  famotidine (PEPCID) 20 MG tablet Take 20 mg by mouth daily.     Flaxseed, Linseed, (FLAX SEED OIL PO) Take 15 mLs by mouth daily.     fluticasone (FLONASE) 50 MCG/ACT nasal spray Place 2 sprays into both nostrils daily as needed for allergies or rhinitis.     loratadine (CLARITIN) 10 MG tablet Take 10 mg by mouth daily.     meclizine (ANTIVERT) 12.5 MG tablet Take 1 tablet (12.5 mg total) by mouth 3 (three) times daily as needed for dizziness. 30 tablet 3   metoprolol tartrate (LOPRESSOR) 25 MG tablet Take 1 tablet (25 mg total) by mouth 2 (two) times daily. 180 tablet 2    polyethylene glycol (MIRALAX / GLYCOLAX) 17 g packet Take 17 g by mouth daily.     scopolamine (TRANSDERM-SCOP) 1 MG/3DAYS Place 1 patch onto the skin every 3 (three) days as needed (Nausea).     simvastatin (ZOCOR) 20 MG tablet TAKE 1 TABLET BY MOUTH EVERYDAY AT BEDTIME 90 tablet 3   No current facility-administered medications for this visit.     Review of Systems  Please see the history of present illness.    (+) Shortness of breath  All other systems reviewed and are otherwise negative except as noted above.  Physical Exam    Wt Readings from Last 3 Encounters:  05/18/22 191 lb 6.4 oz (86.8 kg)  04/20/22 193 lb (87.5 kg)  10/19/21 194 lb 12.8 oz (88.4 kg)   VS: Vitals:   05/18/22 1511  BP: (!) 150/90  Pulse: 88  SpO2: 98%  ,Body mass index is 30.43 kg/m.  Constitutional:      Appearance: Healthy appearance. Not in distress.  Neck:     Vascular: JVD normal.  Pulmonary:     Effort: Pulmonary effort is normal.     Breath sounds: No wheezing. No rales. Diminished in the bases Cardiovascular:     Irregularly irregular. Regular rhythm. Normal S1. Normal S2.      Murmurs: 3/6 holosystolic murmur.  Edema:    Peripheral edema absent.  Abdominal:     Palpations: Abdomen is soft non tender. There is no hepatomegaly.  Skin:    General: Skin is warm and dry.  Neurological:     General: No focal deficit present.     Mental Status: Alert and oriented to person, place and time.     Cranial Nerves: Cranial nerves are intact.  EKG/LABS/Other Studies Reviewed    ECG personally reviewed by me today -atrial fibrillation with no acute changes and rate of 86 bpm with no acute changes  Risk Assessment/Calculations:    CHA2DS2-VASc Score = 3   This indicates a 3.2% annual risk of stroke. The patient's score is based upon: CHF History: 0 HTN History: 0 Diabetes History: 0 Stroke History: 0 Vascular Disease History: 0 Age Score: 2 Gender Score: 1      Lab Results   Component Value Date   WBC 7.3 05/01/2022   HGB 16.3 (H) 05/01/2022   HCT 47.0 (H) 05/01/2022   MCV 88 05/01/2022   PLT 355 05/01/2022   Lab Results  Component Value Date   CREATININE 0.90 05/01/2022   BUN 11 05/01/2022   NA 140 05/01/2022   K 4.2 05/01/2022   CL 99 05/01/2022   CO2 20 05/01/2022   Lab Results  Component Value Date   ALT 20 10/19/2021   AST 22 10/19/2021   ALKPHOS 80 10/19/2021   BILITOT 1.9 (H) 10/19/2021  Lab Results  Component Value Date   CHOL 201 (H) 10/19/2021   HDL 56.60 10/19/2021   LDLCALC 125 (H) 08/25/2015   LDLDIRECT 118.0 10/19/2021   TRIG 315.0 (H) 10/19/2021   CHOLHDL 4 10/19/2021    Lab Results  Component Value Date   HGBA1C 5.5 10/19/2021    Assessment & Plan    1.  Paroxysmal atrial fibrillation: -Today patient is in atrial fibrillation with rate of 86 and is asymptomatic currently  -Patient has been on Eliquis uninterrupted for 4 weeks and we will arrange for DCCV to restore sinus rhythm -BMET and CBC next week -Continue lopressor 25 mg twice daily -Patient reports no bleeding and creatinine was 0.9 and hemoglobin was 16.3 -Continue Eliquis 5 mg twice daily -CHA2DS2-VASc Score = 3 [CHF History: 0, HTN History: 0, Diabetes History: 0, Stroke History: 0, Vascular Disease History: 0, Age Score: 2, Gender Score: 1].  Therefore, the patient's annual risk of stroke is 3.2 %.      2.  Mitral regurgitation: -Regurgitation on most previous 2D echo showed moderate to mild with recommendation of annual follow-up for valve disease -Patient is euvolemic on examination -Blood pressure today was elevated at 150/90 and was unchanged and second reading. -I advised her to check blood pressures over the next week and report back to the office.  If blood pressures remain elevated we will increase beta-blocker or add additional therapy.   3.  Hyperlipidemia: -Patient's last LDL was 118 above goal of less than 70 -Currently followed by  PCP -Continue Zocor 20 mg daily       Disposition: Follow-up with Larae Grooms, MD or APP in 4 weeks  Shared Decision Making/Informed Consent The risks (stroke, cardiac arrhythmias rarely resulting in the need for a temporary or permanent pacemaker, skin irritation or burns and complications associated with conscious sedation including aspiration, arrhythmia, respiratory failure and death), benefits (restoration of normal sinus rhythm) and alternatives of a direct current cardioversion were explained in detail to Ms. Morro and she agrees to proceed.     Medication Adjustments/Labs and Tests Ordered: Current medicines are reviewed at length with the patient today.  Concerns regarding medicines are outlined above.   Signed, Mable Fill, Marissa Nestle, NP 05/18/2022, 4:11 PM [place]

## 2022-05-15 NOTE — H&P (View-Only) (Signed)
Office Visit    Patient Name: Olivia Mendoza Date of Encounter: 05/18/2022  Primary Care Provider:  Hoyt Koch, MD Primary Cardiologist:  Larae Grooms, MD Primary Electrophysiologist: None  Chief Complaint    Olivia Mendoza is a 76 y.o. female with PMH of HLD, aortic stenosis, paroxysmal AF, PVCs, MVP, who presents today for 4 to 6-week follow-up of atrial fibrillation.  Past Medical History    Past Medical History:  Diagnosis Date   Dysphagia    Fibrocystic breast disease    GERD (gastroesophageal reflux disease)    History of cystopexy    History of migraines    Hyperlipemia    IBS (irritable bowel syndrome)    Mononucleosis    MVP (mitral valve prolapse)    Positional vertigo    PVC (premature ventricular contraction)    Routine general medical examination at a health care facility    Past Surgical History:  Procedure Laterality Date   BIOPSY THYROID     BREAST CYST ASPIRATION     LAPAROSCOPIC TUBAL LIGATION     Bilateral   VAGINAL HYSTERECTOMY  92    Allergies  Allergies  Allergen Reactions   Morphine And Related Hives and Nausea And Vomiting   Penicillins    Zetia [Ezetimibe]     Made IBS worse    History of Present Illness    Olivia Mendoza is a 76 year old female with the above-mentioned past medical history who presents today for follow-up of atrial fibrillation.  [name] was last seen by Dr. Irish Lack on 04/20/2022 for preoperative clearance.  She was found to have new onset atrial fibrillation and surgery was postponed and patient was started on Eliquis 5 mg and metoprolol for rate control.  2D echo was ordered and showed normal LV RV function with mild to moderate MR regurg and no aortic stenosis.  She was recommended to follow-up annually for echo for monitoring of valve disease.  She was able to reschedule surgery following echo results and was instructed to hold Eliquis for 2 days prior to procedure.  Plan is to restart Eliquis and  potentially have elective cardioversion following 3 weeks.  [name] presents today for follow-up alone.  Since last being seen in the office patient reports that she has been doing well and notices when she is in atrial fibrillation.  She does not have any chest pain but does endorse shortness of breath when this occurs.  She is tolerating her Lopressor without any side effects.  EKG was obtained today for verification and patient is still in atrial fibrillation with a rate of 88.  We discussed the pathophysiology of atrial fibrillation and the treatment plans moving forward including DCCV.  She has siblings and family numbers that also have had atrial fibrillation and is well well aware of what to expect regarding neck steps.  Patient denies chest pain, palpitations,  PND, orthopnea, nausea, vomiting, dizziness, syncope, edema, weight gain, or early satiety.  Home Medications    Current Outpatient Medications  Medication Sig Dispense Refill   apixaban (ELIQUIS) 5 MG TABS tablet Take 1 tablet (5 mg total) by mouth 2 (two) times daily. 60 tablet 6   Cholecalciferol (VITAMIN D) 50 MCG (2000 UT) CAPS Take 2,000 Units by mouth daily.     Cyanocobalamin (B-12) 1000 MCG TABS Take by mouth.     estradiol (VIVELLE-DOT) 0.1 MG/24HR patch estradiol 0.1 mg/24 hr semiweekly transdermal patch  APPLY 1 PATCH TRANSDERMALLY TWICE A WEEK  famotidine (PEPCID) 20 MG tablet Take 20 mg by mouth daily.     Flaxseed, Linseed, (FLAX SEED OIL PO) Take 15 mLs by mouth daily.     fluticasone (FLONASE) 50 MCG/ACT nasal spray Place 2 sprays into both nostrils daily as needed for allergies or rhinitis.     loratadine (CLARITIN) 10 MG tablet Take 10 mg by mouth daily.     meclizine (ANTIVERT) 12.5 MG tablet Take 1 tablet (12.5 mg total) by mouth 3 (three) times daily as needed for dizziness. 30 tablet 3   metoprolol tartrate (LOPRESSOR) 25 MG tablet Take 1 tablet (25 mg total) by mouth 2 (two) times daily. 180 tablet 2    polyethylene glycol (MIRALAX / GLYCOLAX) 17 g packet Take 17 g by mouth daily.     scopolamine (TRANSDERM-SCOP) 1 MG/3DAYS Place 1 patch onto the skin every 3 (three) days as needed (Nausea).     simvastatin (ZOCOR) 20 MG tablet TAKE 1 TABLET BY MOUTH EVERYDAY AT BEDTIME 90 tablet 3   No current facility-administered medications for this visit.     Review of Systems  Please see the history of present illness.    (+) Shortness of breath  All other systems reviewed and are otherwise negative except as noted above.  Physical Exam    Wt Readings from Last 3 Encounters:  05/18/22 191 lb 6.4 oz (86.8 kg)  04/20/22 193 lb (87.5 kg)  10/19/21 194 lb 12.8 oz (88.4 kg)   VS: Vitals:   05/18/22 1511  BP: (!) 150/90  Pulse: 88  SpO2: 98%  ,Body mass index is 30.43 kg/m.  Constitutional:      Appearance: Healthy appearance. Not in distress.  Neck:     Vascular: JVD normal.  Pulmonary:     Effort: Pulmonary effort is normal.     Breath sounds: No wheezing. No rales. Diminished in the bases Cardiovascular:     Irregularly irregular. Regular rhythm. Normal S1. Normal S2.      Murmurs: 3/6 holosystolic murmur.  Edema:    Peripheral edema absent.  Abdominal:     Palpations: Abdomen is soft non tender. There is no hepatomegaly.  Skin:    General: Skin is warm and dry.  Neurological:     General: No focal deficit present.     Mental Status: Alert and oriented to person, place and time.     Cranial Nerves: Cranial nerves are intact.  EKG/LABS/Other Studies Reviewed    ECG personally reviewed by me today -atrial fibrillation with no acute changes and rate of 86 bpm with no acute changes  Risk Assessment/Calculations:    CHA2DS2-VASc Score = 3   This indicates a 3.2% annual risk of stroke. The patient's score is based upon: CHF History: 0 HTN History: 0 Diabetes History: 0 Stroke History: 0 Vascular Disease History: 0 Age Score: 2 Gender Score: 1      Lab Results   Component Value Date   WBC 7.3 05/01/2022   HGB 16.3 (H) 05/01/2022   HCT 47.0 (H) 05/01/2022   MCV 88 05/01/2022   PLT 355 05/01/2022   Lab Results  Component Value Date   CREATININE 0.90 05/01/2022   BUN 11 05/01/2022   NA 140 05/01/2022   K 4.2 05/01/2022   CL 99 05/01/2022   CO2 20 05/01/2022   Lab Results  Component Value Date   ALT 20 10/19/2021   AST 22 10/19/2021   ALKPHOS 80 10/19/2021   BILITOT 1.9 (H) 10/19/2021  Lab Results  Component Value Date   CHOL 201 (H) 10/19/2021   HDL 56.60 10/19/2021   LDLCALC 125 (H) 08/25/2015   LDLDIRECT 118.0 10/19/2021   TRIG 315.0 (H) 10/19/2021   CHOLHDL 4 10/19/2021    Lab Results  Component Value Date   HGBA1C 5.5 10/19/2021    Assessment & Plan    1.  Paroxysmal atrial fibrillation: -Today patient is in atrial fibrillation with rate of 86 and is asymptomatic currently  -Patient has been on Eliquis uninterrupted for 4 weeks and we will arrange for DCCV to restore sinus rhythm -BMET and CBC next week -Continue lopressor 25 mg twice daily -Patient reports no bleeding and creatinine was 0.9 and hemoglobin was 16.3 -Continue Eliquis 5 mg twice daily -CHA2DS2-VASc Score = 3 [CHF History: 0, HTN History: 0, Diabetes History: 0, Stroke History: 0, Vascular Disease History: 0, Age Score: 2, Gender Score: 1].  Therefore, the patient's annual risk of stroke is 3.2 %.      2.  Mitral regurgitation: -Regurgitation on most previous 2D echo showed moderate to mild with recommendation of annual follow-up for valve disease -Patient is euvolemic on examination -Blood pressure today was elevated at 150/90 and was unchanged and second reading. -I advised her to check blood pressures over the next week and report back to the office.  If blood pressures remain elevated we will increase beta-blocker or add additional therapy.   3.  Hyperlipidemia: -Patient's last LDL was 118 above goal of less than 70 -Currently followed by  PCP -Continue Zocor 20 mg daily       Disposition: Follow-up with Larae Grooms, MD or APP in 4 weeks  Shared Decision Making/Informed Consent The risks (stroke, cardiac arrhythmias rarely resulting in the need for a temporary or permanent pacemaker, skin irritation or burns and complications associated with conscious sedation including aspiration, arrhythmia, respiratory failure and death), benefits (restoration of normal sinus rhythm) and alternatives of a direct current cardioversion were explained in detail to Ms. Coote and she agrees to proceed.     Medication Adjustments/Labs and Tests Ordered: Current medicines are reviewed at length with the patient today.  Concerns regarding medicines are outlined above.   Signed, Mable Fill, Marissa Nestle, NP 05/18/2022, 4:11 PM [place]

## 2022-05-18 ENCOUNTER — Ambulatory Visit: Payer: Medicare Other | Attending: Nurse Practitioner | Admitting: Nurse Practitioner

## 2022-05-18 ENCOUNTER — Encounter: Payer: Self-pay | Admitting: Nurse Practitioner

## 2022-05-18 VITALS — BP 150/90 | HR 88 | Ht 66.5 in | Wt 191.4 lb

## 2022-05-18 DIAGNOSIS — I4891 Unspecified atrial fibrillation: Secondary | ICD-10-CM | POA: Diagnosis not present

## 2022-05-18 DIAGNOSIS — I341 Nonrheumatic mitral (valve) prolapse: Secondary | ICD-10-CM | POA: Insufficient documentation

## 2022-05-18 DIAGNOSIS — E785 Hyperlipidemia, unspecified: Secondary | ICD-10-CM | POA: Insufficient documentation

## 2022-05-18 NOTE — Patient Instructions (Signed)
Medication Instructions:   Your physician recommends that you continue on your current medications as directed. Please refer to the Current Medication list given to you today.   *If you need a refill on your cardiac medications before your next appointment, please call your pharmacy*   Lab Work:  Your physician recommends that you return for lab work on Wednesday, September 13. You can come in on the day of your appointment anytime between 7:30-4:30.   If you have labs (blood work) drawn today and your tests are completely normal, you will receive your results only by: [place] (if you have MyChart) OR A paper copy in the mail If you have any lab test that is abnormal or we need to change your treatment, we will call you to review the results.   Testing/Procedures: Dear Ms. Glennon Mac,  You are scheduled for a TEE/Cardioversion/TEE Cardioversion on Tuesday, September 19 with Dr. Delana Meyer.  Please arrive at the [facility] (Main Entrance A) at [facility]: [address] [place], [place] [address] at 10:00 am. (1 hour prior to procedure unless lab work is needed; if lab work is needed arrive 1.5 hours ahead)  DIET: Nothing to eat or drink after midnight except a sip of water with medications (see medication instructions below)  FYI: For your safety, and to allow [place] to monitor your vital signs accurately during the surgery/procedure we request that   if you have artificial nails, gel coating, SNS etc. Please have those removed prior to your surgery/procedure. Not having the nail coverings /polish removed may result in cancellation or delay of your surgery/procedure.   Medication Instructions:  Continue your anticoagulant: Eliquis You will need to continue your anticoagulant after your procedure until you  are told by your  Provider that it is safe to stop  You must have a responsible person to drive you home and stay in the waiting area during your procedure. Failure to do  so could result in cancellation.  Bring your insurance cards.  *Special Note: Every effort is made to have your procedure done on time. Occasionally there are emergencies that occur at the hospital that may cause delays. Please be patient if a delay does occur.     Follow-Up: At [facility], you and your health needs are our priority.  As part of our continuing mission to provide you with exceptional heart care, we have created designated Provider Care Teams.  These Care Teams include your primary Cardiologist (physician) and Advanced Practice Providers (APPs -  Physician Assistants and Nurse Practitioners) who all work together to provide you with the care you need, when you need it.  We recommend signing up for the patient portal called "MyChart".  Sign up information is provided on this After Visit Summary.  MyChart is used to connect with patients for Virtual Visits (Telemedicine).  Patients are able to view lab/test results, encounter notes, upcoming appointments, etc.  Non-urgent messages can be sent to your provider as well.   To learn more about what you can do with MyChart, go to [email or web address].    Your next appointment:   4 week(s)  The format for your next appointment:   In Person  Provider:   Ambrose Pancoast, NP          Important Information About [place]

## 2022-05-18 NOTE — Addendum Note (Signed)
Addended by: Tempie Donning on: 05/18/2022 04:39 PM   Modules accepted: Orders

## 2022-05-22 ENCOUNTER — Encounter (HOSPITAL_COMMUNITY): Payer: Self-pay | Admitting: Internal Medicine

## 2022-05-23 ENCOUNTER — Ambulatory Visit: Payer: Medicare Other | Attending: Internal Medicine

## 2022-05-23 DIAGNOSIS — E785 Hyperlipidemia, unspecified: Secondary | ICD-10-CM | POA: Diagnosis not present

## 2022-05-23 DIAGNOSIS — I341 Nonrheumatic mitral (valve) prolapse: Secondary | ICD-10-CM | POA: Diagnosis not present

## 2022-05-23 DIAGNOSIS — I4891 Unspecified atrial fibrillation: Secondary | ICD-10-CM | POA: Diagnosis not present

## 2022-05-23 LAB — CBC
Hematocrit: 46.4 % (ref 34.0–46.6)
Hemoglobin: 15.7 g/dL (ref 11.1–15.9)
MCH: 30 pg (ref 26.6–33.0)
MCHC: 33.8 g/dL (ref 31.5–35.7)
MCV: 89 fL (ref 79–97)
Platelets: 312 10*3/uL (ref 150–450)
RBC: 5.24 x10E6/uL (ref 3.77–5.28)
RDW: 13.8 % (ref 11.7–15.4)
WBC: 6.2 10*3/uL (ref 3.4–10.8)

## 2022-05-23 LAB — BASIC METABOLIC PANEL
BUN/Creatinine Ratio: 13 (ref 12–28)
BUN: 11 mg/dL (ref 8–27)
CO2: 26 mmol/L (ref 20–29)
Calcium: 9.8 mg/dL (ref 8.7–10.3)
Chloride: 101 mmol/L (ref 96–106)
Creatinine, Ser: 0.88 mg/dL (ref 0.57–1.00)
Glucose: 143 mg/dL — ABNORMAL HIGH (ref 70–99)
Potassium: 4.6 mmol/L (ref 3.5–5.2)
Sodium: 137 mmol/L (ref 134–144)
eGFR: 68 mL/min/{1.73_m2} (ref >59)

## 2022-05-24 ENCOUNTER — Telehealth: Payer: Self-pay | Admitting: *Deleted

## 2022-05-24 NOTE — Telephone Encounter (Signed)
Patient brought application for Eliquis assistance to the office.  Dr Irish Lack completed provider section.  Paperwork faxed to Assistance program

## 2022-05-28 NOTE — Anesthesia Preprocedure Evaluation (Signed)
Anesthesia Evaluation  Patient identified by MRN, date of birth, ID band Patient awake    Reviewed: Allergy & Precautions, NPO status , Patient's Chart, lab work & pertinent test results  Airway Mallampati: II  TM Distance: >3 FB Neck ROM: Full    Dental  (+) Dental Advisory Given, Partial Upper, Missing   Pulmonary neg pulmonary ROS,    Pulmonary exam normal breath sounds clear to auscultation       Cardiovascular + PND  + Valvular Problems/Murmurs MVP, MR and AI  Rhythm:Irregular Rate:Normal  Echo 04/2022 1. Left ventricular ejection fraction, by estimation, is 60 to 65%. The left ventricle has normal function. The left ventricle has no regional wall motion abnormalities. Left ventricular diastolic function could not be evaluated.  2. Right ventricular systolic function is normal. The right ventricular size is mildly enlarged. There is normal pulmonary artery systolic pressure. The estimated right ventricular systolic pressure is 93.7 mmHg.  3. Left atrial size was mildly dilated.  4. The mitral valve is normal in structure. Mild to moderate mitral valve regurgitation. No evidence of mitral stenosis.  5. The aortic valve is tricuspid. Aortic valve regurgitation is moderate. Aortic valve sclerosis/calcification is present, without any evidence of aortic stenosis. Aortic regurgitation PHT measures 437 msec.  6. The inferior vena cava is normal in size with greater than 50% respiratory variability, suggesting right atrial pressure of 3 mmHg.    Neuro/Psych negative neurological ROS     GI/Hepatic Neg liver ROS, GERD  Controlled,  Endo/Other  negative endocrine ROS  Renal/GU negative Renal ROS     Musculoskeletal negative musculoskeletal ROS (+)   Abdominal (+) + obese,   Peds  Hematology negative hematology ROS (+)   Anesthesia Other Findings   Reproductive/Obstetrics                             Anesthesia Physical Anesthesia Plan  ASA: 3  Anesthesia Plan: General   Post-op Pain Management: Minimal or no pain anticipated   Induction: Intravenous  PONV Risk Score and Plan: 3 and Propofol infusion, TIVA and Treatment may vary due to age or medical condition  Airway Management Planned: Natural Airway and Mask  Additional Equipment:   Intra-op Plan:   Post-operative Plan:   Informed Consent: I have reviewed the patients History and Physical, chart, labs and discussed the procedure including the risks, benefits and alternatives for the proposed anesthesia with the patient or authorized representative who has indicated his/her understanding and acceptance.     Dental advisory given  Plan Discussed with: CRNA  Anesthesia Plan Comments:        Anesthesia Quick Evaluation

## 2022-05-29 ENCOUNTER — Encounter (HOSPITAL_COMMUNITY): Payer: Self-pay | Admitting: Internal Medicine

## 2022-05-29 ENCOUNTER — Ambulatory Visit (HOSPITAL_COMMUNITY)
Admission: RE | Admit: 2022-05-29 | Discharge: 2022-05-29 | Disposition: A | Discharge status: Home / Self Care | Payer: Medicare Other | Source: Ambulatory Visit | Attending: Internal Medicine | Admitting: Internal Medicine

## 2022-05-29 ENCOUNTER — Ambulatory Visit (HOSPITAL_COMMUNITY): Payer: Medicare Other | Admitting: Anesthesiology

## 2022-05-29 ENCOUNTER — Encounter (HOSPITAL_COMMUNITY)
Admission: RE | Disposition: A | Discharge status: Home / Self Care | Payer: Self-pay | Source: Ambulatory Visit | Attending: Internal Medicine

## 2022-05-29 ENCOUNTER — Ambulatory Visit (HOSPITAL_BASED_OUTPATIENT_CLINIC_OR_DEPARTMENT_OTHER): Payer: Medicare Other | Admitting: Anesthesiology

## 2022-05-29 ENCOUNTER — Other Ambulatory Visit: Payer: Self-pay

## 2022-05-29 DIAGNOSIS — Z7901 Long term (current) use of anticoagulants: Secondary | ICD-10-CM | POA: Insufficient documentation

## 2022-05-29 DIAGNOSIS — K219 Gastro-esophageal reflux disease without esophagitis: Secondary | ICD-10-CM | POA: Diagnosis not present

## 2022-05-29 DIAGNOSIS — I480 Paroxysmal atrial fibrillation: Secondary | ICD-10-CM | POA: Insufficient documentation

## 2022-05-29 DIAGNOSIS — E785 Hyperlipidemia, unspecified: Secondary | ICD-10-CM | POA: Diagnosis not present

## 2022-05-29 DIAGNOSIS — Z79899 Other long term (current) drug therapy: Secondary | ICD-10-CM | POA: Insufficient documentation

## 2022-05-29 DIAGNOSIS — Z6830 Body mass index (BMI) 30.0-30.9, adult: Secondary | ICD-10-CM | POA: Diagnosis not present

## 2022-05-29 DIAGNOSIS — I4891 Unspecified atrial fibrillation: Secondary | ICD-10-CM | POA: Diagnosis not present

## 2022-05-29 DIAGNOSIS — I088 Other rheumatic multiple valve diseases: Secondary | ICD-10-CM | POA: Diagnosis not present

## 2022-05-29 DIAGNOSIS — E669 Obesity, unspecified: Secondary | ICD-10-CM

## 2022-05-29 DIAGNOSIS — I080 Rheumatic disorders of both mitral and aortic valves: Secondary | ICD-10-CM

## 2022-05-29 HISTORY — PX: CARDIOVERSION: SHX1299

## 2022-05-29 SURGERY — CARDIOVERSION
Anesthesia: General

## 2022-05-29 MED ORDER — LIDOCAINE 2% (20 MG/ML) 5 ML SYRINGE
INTRAMUSCULAR | Status: DC | PRN
  Administered 2022-05-29: 80 mg via INTRAVENOUS

## 2022-05-29 MED ORDER — SODIUM CHLORIDE 0.9 % IV SOLN: INTRAVENOUS | Status: DC

## 2022-05-29 MED ORDER — PROPOFOL 10 MG/ML IV BOLUS
INTRAVENOUS | Status: DC | PRN
  Administered 2022-05-29: 60 mg via INTRAVENOUS

## 2022-05-29 NOTE — CV Procedure (Signed)
Procedure: Electrical Cardioversion Indications:  Atrial Fibrillation  Procedure Details:  Consent: Risks of procedure as well as the alternatives and risks of each were explained to the (patient/caregiver).  Consent for procedure obtained.  Time Out: Verified patient identification, verified procedure, site/side was marked, verified correct patient position, special equipment/implants available, medications/allergies/relevent history reviewed, required imaging and test results available. PERFORMED.  Patient placed on cardiac monitor, pulse oximetry, supplemental oxygen as necessary.  Sedation given:  propofol per anesthesia Pacer pads placed anterior and posterior chest.  Cardioverted 2 time(s).  Cardioversion with synchronized biphasic 120J, 200J shock.  Evaluation: Findings: Post procedure EKG shows: NSR Complications: None Patient did tolerate procedure well.  Time Spent Directly with the Patient:  30 minutes   Elouise Munroe 05/29/2022, 10:43 AM

## 2022-05-29 NOTE — Discharge Instructions (Signed)

## 2022-05-29 NOTE — Transfer of Care (Signed)
Immediate Anesthesia Transfer of Care Note  Patient: Olivia Mendoza  Procedure(s) Performed: CARDIOVERSION  Patient Location: PACU  Anesthesia Type:General  Level of Consciousness: awake and oriented  Airway & Oxygen Therapy: Patient Spontanous Breathing and Patient connected to nasal cannula oxygen  Post-op Assessment: Report given to RN and Post -op Vital signs reviewed and stable  Post vital signs: Reviewed and stable  Last Vitals:  Vitals Value Taken Time  BP    Temp    Pulse    Resp    SpO2      Last Pain:  Vitals:   05/29/22 0952  TempSrc: Tympanic  PainSc: 0-No pain         Complications: No notable events documented.

## 2022-05-29 NOTE — Interval H&P Note (Signed)
History and Physical Interval Note:  05/29/2022 10:31 AM  Olivia Mendoza  has presented today for surgery, with the diagnosis of AFIB.  The various methods of treatment have been discussed with the patient and family. After consideration of risks, benefits and other options for treatment, the patient has consented to  Procedure(s): CARDIOVERSION (N/A) as a surgical intervention.  The patient's history has been reviewed, patient examined, no change in status, stable for surgery.  I have reviewed the patient's chart and labs.  Questions were answered to the patient's satisfaction.     Elouise Munroe

## 2022-05-29 NOTE — Anesthesia Postprocedure Evaluation (Signed)
Anesthesia Post Note  Patient: Olivia Mendoza  Procedure(s) Performed: CARDIOVERSION     Patient location during evaluation: PACU Anesthesia Type: General Level of consciousness: patient cooperative and awake and alert Pain management: pain level controlled Vital Signs Assessment: post-procedure vital signs reviewed and stable Respiratory status: spontaneous breathing Cardiovascular status: stable Anesthetic complications: no   No notable events documented.  Last Vitals:  Vitals:   05/29/22 1100 05/29/22 1110  BP: (!) 125/50 (!) 138/57  Pulse: 61 68  Resp: 17 16  Temp:    SpO2: 93% 94%    Last Pain:  Vitals:   05/29/22 1110  TempSrc:   PainSc: 0-No pain                 Nolon Nations

## 2022-05-31 NOTE — Telephone Encounter (Signed)
**Note De-Identified Treacy Holcomb Obfuscation** Letter received from [facility] Ameera Tigue Fax stating that they have approved the pt for Eliquis assistance until 09/09/2022. [id number]  The letter states that they have notified the pt of this approval as well.

## 2022-06-26 NOTE — Progress Notes (Unsigned)
Office Visit    Patient Name: Olivia Mendoza Date of Encounter: 06/27/2022  Primary Care Provider:  Hoyt Koch, MD Primary Cardiologist:  Larae Grooms, MD Primary Electrophysiologist: None  Chief Complaint    Olivia Mendoza is a 76 y.o. female with PMH of HLD, aortic stenosis, paroxysmal AF, PVCs, MVP, who presents today for post DCCV follow-up.  Past Medical History    Past Medical History:  Diagnosis Date   Dysphagia    Fibrocystic breast disease    GERD (gastroesophageal reflux disease)    History of cystopexy    History of migraines    Hyperlipemia    IBS (irritable bowel syndrome)    Mononucleosis    MVP (mitral valve prolapse)    Positional vertigo    PVC (premature ventricular contraction)    Routine general medical examination at a health care facility    Past Surgical History:  Procedure Laterality Date   BIOPSY THYROID     BREAST CYST ASPIRATION     CARDIOVERSION N/A 05/29/2022   Procedure: CARDIOVERSION;  Surgeon: Elouise Munroe, MD;  Location: [facility];  Service: Cardiovascular;  Laterality: N/A;   LAPAROSCOPIC TUBAL LIGATION     Bilateral   VAGINAL HYSTERECTOMY  92    Allergies  Allergies  Allergen Reactions   Morphine And Related Hives and Nausea And Vomiting   Penicillins    Zetia [Ezetimibe]     Made IBS worse    History of Present Illness    Olivia Mendoza is a 76 year old female with the above-mentioned past medical history who presents today for follow-up of atrial fibrillation.  Ms. Struve was last seen by Dr. Irish Lack on 04/20/2022 for preoperative clearance.  She was found to have new onset atrial fibrillation and surgery was postponed and patient was started on Eliquis 5 mg and metoprolol for rate control.  2D echo was ordered and showed normal LV RV function with mild to moderate MR regurg and no aortic stenosis.  She was recommended to follow-up annually for echo for monitoring of valve disease.  She was able to  reschedule surgery following echo results and was instructed to hold Eliquis for 2 days prior to procedure.  Plan is to restart Eliquis and potentially have elective cardioversion following 3 weeks.  Patient was seen in follow-up on 05/18/2022 for pre-DCCV visit.  Patient had an EKG completed that verified atrial fibrillation was still present.  Only a 1 month follow-up she tolerated procedure without any complications.  Ms. Duby presents today for follow-up of recent DCCV along.  Since last being seen in the office patient reports that she has been feeling out of breath and with no energy since beginning metoprolol.  Following her cardioversion she was in sinus rhythm however today she is back in A-fib with controlled rate of 86 bpm.  Her blood pressure today however is elevated at 156/80 and was 154/78 on recheck.  She denies any chest pain or shortness of breath currently and is compliant with her current medication regimen.  During our visit we discussed the pathophysiology of atrial fibrillation and discussed the next phase of her treatment plan which will include referral to the atrial fibrillation clinic.[name]  Patient denies chest pain, palpitations, dyspnea, PND, orthopnea, nausea, vomiting, dizziness, syncope, edema, weight gain, or early satiety.   Home Medications    Current Outpatient Medications  Medication Sig Dispense Refill   apixaban (ELIQUIS) 5 MG TABS tablet Take 1 tablet (5 mg total) by mouth 2 (two)  times daily. 60 tablet 6   Cholecalciferol (VITAMIN D) 50 MCG (2000 UT) CAPS Take 2,000 Units by mouth daily.     Cyanocobalamin (B-12) 1000 MCG TABS Take 1,000 mcg by mouth daily.     estradiol (VIVELLE-DOT) 0.1 MG/24HR patch Place 0.5 patches onto the skin 2 (two) times a week.     famotidine (PEPCID) 20 MG tablet Take 20 mg by mouth daily.     Flaxseed, Linseed, (FLAX SEED OIL PO) Take 15 mLs by mouth daily.     fluticasone (FLONASE) 50 MCG/ACT nasal spray Place 2 sprays into both  nostrils daily as needed for allergies or rhinitis.     loratadine (CLARITIN) 10 MG tablet Take 10 mg by mouth daily.     meclizine (ANTIVERT) 12.5 MG tablet Take 1 tablet (12.5 mg total) by mouth 3 (three) times daily as needed for dizziness. 30 tablet 3   metoprolol tartrate (LOPRESSOR) 25 MG tablet Take 1 tablet (25 mg total) by mouth 2 (two) times daily. 180 tablet 2   polyethylene glycol (MIRALAX / GLYCOLAX) 17 g packet Take 17 g by mouth See admin instructions. Mon, Tu Wed Th and Sat ONLY     scopolamine (TRANSDERM-SCOP) 1 MG/3DAYS Place 1 patch onto the skin every 3 (three) days as needed (Nausea).     simvastatin (ZOCOR) 20 MG tablet TAKE 1 TABLET BY MOUTH EVERYDAY AT BEDTIME 90 tablet 3   No current facility-administered medications for this visit.     Review of Systems  Please see the history of present illness.    (+) Shortness of breath with activity (+) Fatigue  All other systems reviewed and are otherwise negative except as noted above.  Physical Exam    Wt Readings from Last 3 Encounters:  06/27/22 192 lb (87.1 kg)  05/29/22 191 lb (86.6 kg)  05/18/22 191 lb 6.4 oz (86.8 kg)   VS: Vitals:   06/27/22 1511  BP: (!) 156/80  Pulse: 86  SpO2: 95%  ,Body mass index is 30.53 kg/m.  Constitutional:      Appearance: Healthy appearance. Not in distress.  Neck:     Vascular: JVD normal.  Pulmonary:     Effort: Pulmonary effort is normal.     Breath sounds: No wheezing. No rales. Diminished in the bases Cardiovascular:     Irregularly irregular normal S1. Normal S2.      Murmurs: There is no murmur.  Edema:    Peripheral edema absent.  Abdominal:     Palpations: Abdomen is soft non tender. There is no hepatomegaly.  Skin:    General: Skin is warm and dry.  Neurological:     General: No focal deficit present.     Mental Status: Alert and oriented to person, place and time.     Cranial Nerves: Cranial nerves are intact.  EKG/LABS/Other Studies Reviewed    ECG  personally reviewed by me today -atrial fibrillation with right axis deviation and nonspecific ST and T wave abnormalities with rate of 86 bpm.  Risk Assessment/Calculations:    CHA2DS2-VASc Score = 3   This indicates a 3.2% annual risk of stroke. The patient's score is based upon: CHF History: 0 HTN History: 0 Diabetes History: 0 Stroke History: 0 Vascular Disease History: 0 Age Score: 2 Gender Score: 1           Lab Results  Component Value Date   WBC 6.2 05/23/2022   HGB 15.7 05/23/2022   HCT 46.4 05/23/2022   MCV  89 05/23/2022   PLT 312 05/23/2022   Lab Results  Component Value Date   CREATININE 0.88 05/23/2022   BUN 11 05/23/2022   NA 137 05/23/2022   K 4.6 05/23/2022   CL 101 05/23/2022   CO2 26 05/23/2022   Lab Results  Component Value Date   ALT 20 10/19/2021   AST 22 10/19/2021   ALKPHOS 80 10/19/2021   BILITOT 1.9 (H) 10/19/2021   Lab Results  Component Value Date   CHOL 201 (H) 10/19/2021   HDL 56.60 10/19/2021   LDLCALC 125 (H) 08/25/2015   LDLDIRECT 118.0 10/19/2021   TRIG 315.0 (H) 10/19/2021   CHOLHDL 4 10/19/2021    Lab Results  Component Value Date   HGBA1C 5.5 10/19/2021    Assessment & Plan    1.  Paroxysmal atrial fibrillation: -She underwent DCCV on 9/19 and was cardioverted to normal sinus rhythm.  -Ambulatory referral to atrial fibrillation clinic for further management  -Continue lopressor 25 mg twice daily -Patient reports no bleeding and creatinine was 0.9 and hemoglobin was 16.3 -Continue Eliquis 5 mg twice daily -CHA2DS2-VASc Score = 3 [CHF History: 0, HTN History: 0, Diabetes History: 0, Stroke History: 0, Vascular Disease History: 0, Age Score: 2, Gender Score: 1].  Therefore, the patient's annual risk of stroke is 3.2 %.       2.  Mitral regurgitation: -Regurgitation on most previous 2D echo showed moderate to mild with recommendation of annual follow-up for valve disease -Patient is euvolemic on examination -Blood  pressure today was elevated at 154/78 and was unchanged and second reading. -I advised her to check blood pressures over the next week and report back to the office.  If blood pressures remain elevated we will increase beta-blocker or add additional therapy.   3.  Hyperlipidemia: -Patient's last LDL was 118 above goal of less than 70 -Currently followed by PCP -Continue Zocor 20 mg daily   4.  Essential hypertension: -Patient's blood pressure today was elevated at 156/80 and was 154/78 on recheck -We will start HCTZ 12.5 mg today -BMET in 1 week -Patient was advised to check blood pressures over the next 2 weeks and report readings back to our office.      Disposition: Follow-up with Larae Grooms, MD or APP in 1 months     Medication Adjustments/Labs and Tests Ordered: Current medicines are reviewed at length with the patient today.  Concerns regarding medicines are outlined above.   Signed, Mable Fill, Marissa Nestle, NP 06/27/2022, 4:07 PM [facility]  Note:  This document was prepared using Dragon voice recognition software and may include unintentional dictation errors.

## 2022-06-27 ENCOUNTER — Encounter: Payer: Self-pay | Admitting: Nurse Practitioner

## 2022-06-27 ENCOUNTER — Ambulatory Visit: Payer: Medicare Other | Attending: Nurse Practitioner | Admitting: Nurse Practitioner

## 2022-06-27 VITALS — BP 154/78 | HR 86 | Ht 66.5 in | Wt 192.0 lb

## 2022-06-27 DIAGNOSIS — I10 Essential (primary) hypertension: Secondary | ICD-10-CM | POA: Diagnosis not present

## 2022-06-27 DIAGNOSIS — I340 Nonrheumatic mitral (valve) insufficiency: Secondary | ICD-10-CM | POA: Insufficient documentation

## 2022-06-27 DIAGNOSIS — E785 Hyperlipidemia, unspecified: Secondary | ICD-10-CM | POA: Diagnosis not present

## 2022-06-27 DIAGNOSIS — I480 Paroxysmal atrial fibrillation: Secondary | ICD-10-CM | POA: Diagnosis not present

## 2022-06-27 MED ORDER — HYDROCHLOROTHIAZIDE 12.5 MG PO CAPS: 12.5000 mg | ORAL_CAPSULE | Freq: Every day | ORAL | 3 refills | Status: DC

## 2022-06-27 NOTE — Addendum Note (Signed)
Addended by: Vergia Alcon A on: 06/27/2022 04:28 PM   Modules accepted: Orders

## 2022-06-27 NOTE — Patient Instructions (Signed)
Medication Instructions:  Your physician has recommended you make the following change in your medication:   Start taking hydrochlorothiazide 12.'5mg'$  daily   *If you need a refill on your cardiac medications before your next appointment, please call your pharmacy*   Lab Work: BMET If you have labs (blood work) drawn today and your tests are completely normal, you will receive your results only by: [place] (if you have MyChart) OR A paper copy in the mail If you have any lab test that is abnormal or we need to change your treatment, we will call you to review the results.   Follow-Up: At [facility], you and your health needs are our priority.  As part of our continuing mission to provide you with exceptional heart care, we have created designated Provider Care Teams.  These Care Teams include your primary Cardiologist (physician) and Advanced Practice Providers (APPs -  Physician Assistants and Nurse Practitioners) who all work together to provide you with the care you need, when you need it.  We recommend signing up for the patient portal called "MyChart".  Sign up information is provided on this After Visit Summary.  MyChart is used to connect with patients for Virtual Visits (Telemedicine).  Patients are able to view lab/test results, encounter notes, upcoming appointments, etc.  Non-urgent messages can be sent to your provider as well.   To learn more about what you can do with MyChart, go to [email or web address].    Your next appointment:   1 month(s)  The format for your next appointment:   In Person  Provider:   Ambrose Pancoast, NP       Other Instructions Your have been referred to the [facility]

## 2022-06-28 DIAGNOSIS — Z23 Encounter for immunization: Secondary | ICD-10-CM | POA: Diagnosis not present

## 2022-07-01 IMAGING — MG MM BREAST LOCALIZATION CLIP
4 series · 4 of 12 positions shown · non-contrast
Comparison: Previous exam(s).

CLINICAL DATA: Evaluate spiral HydroMARK biopsy clip placement
following ultrasound-guided LEFT breast biopsy.

EXAM:
3D DIAGNOSTIC LEFT MAMMOGRAM POST ULTRASOUND BIOPSY

[L ML synth-2D]
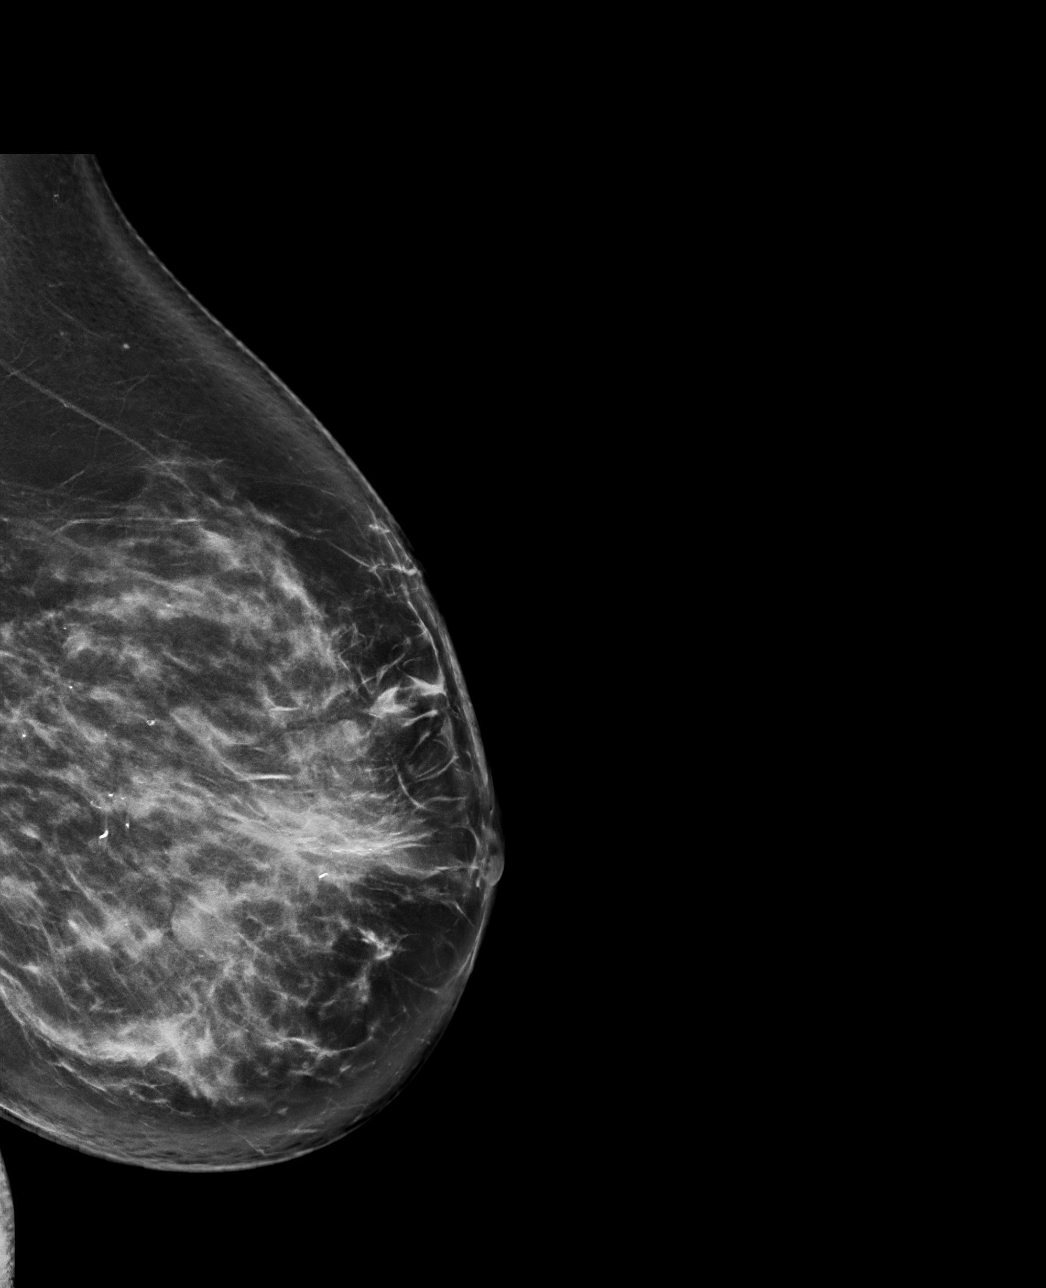

[L CC synth-2D]
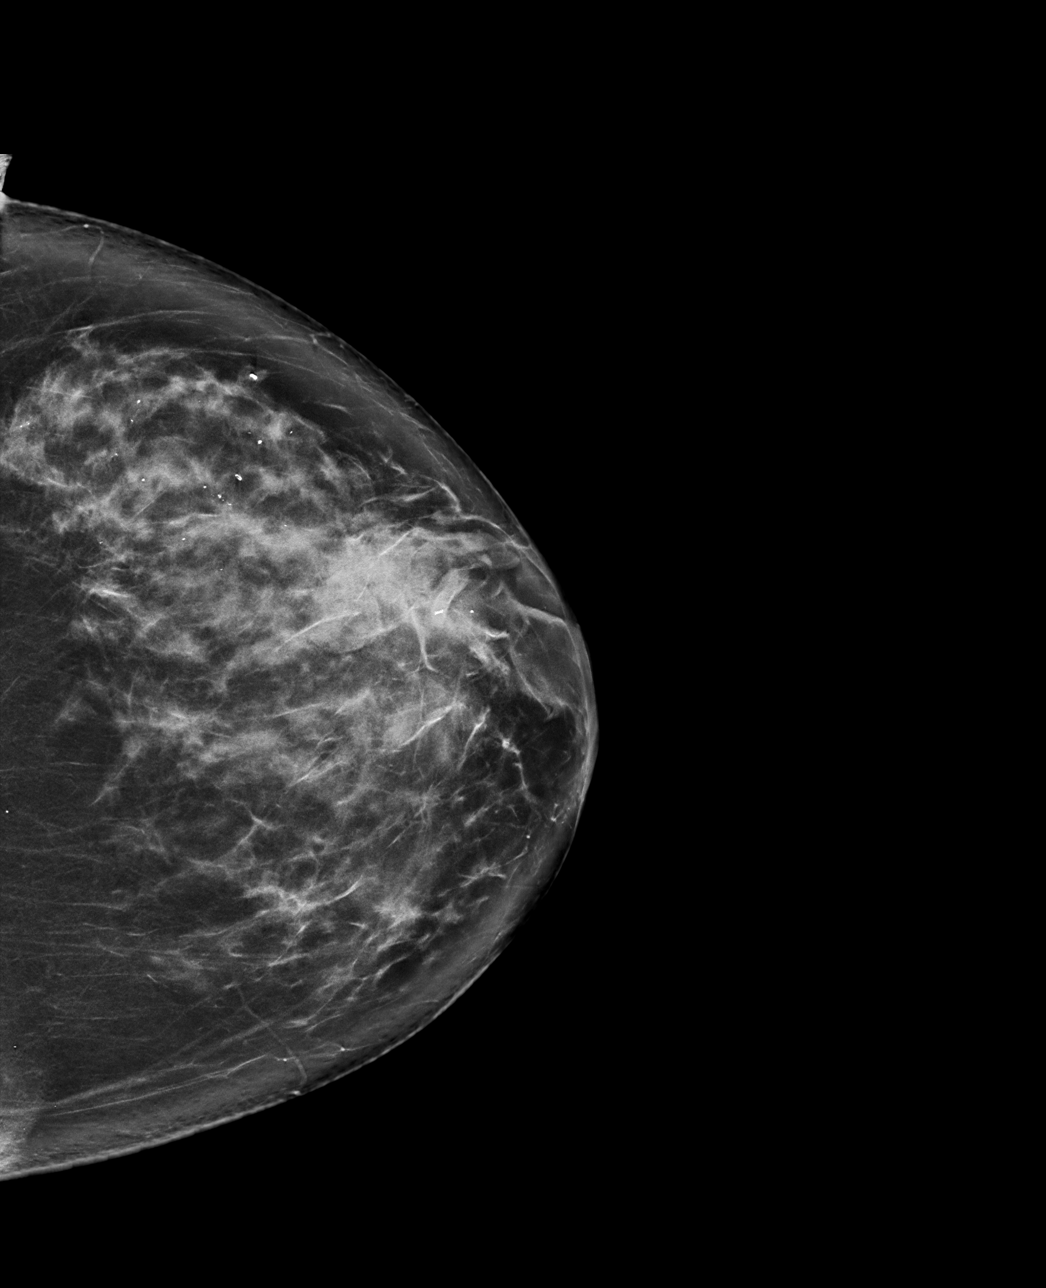

[L ML tomo · tomo slice 47/94.0]
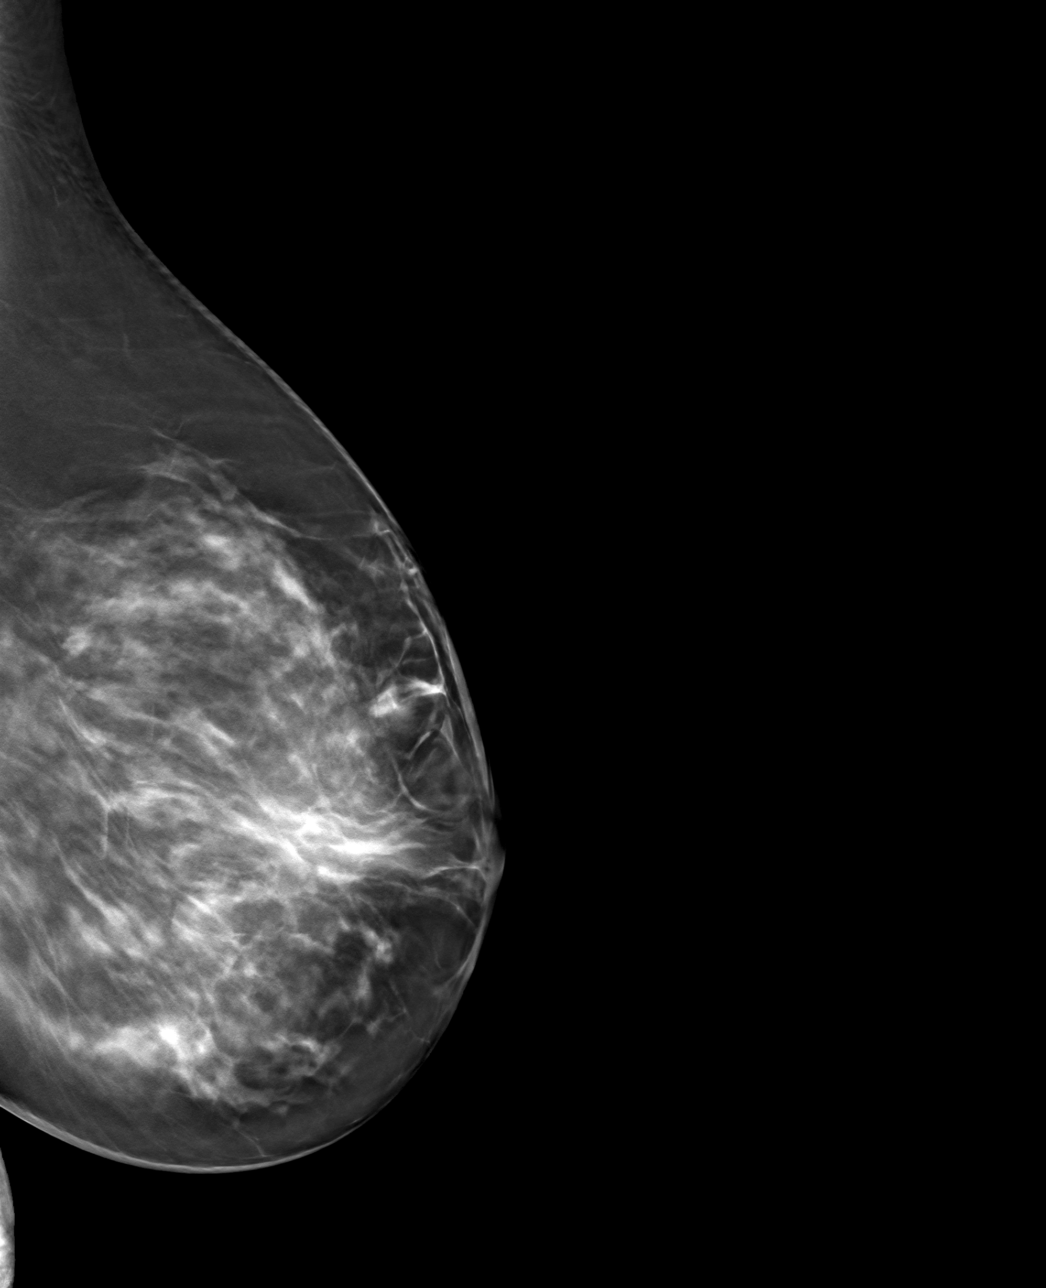

[L CC tomo · tomo slice 45/89.0]
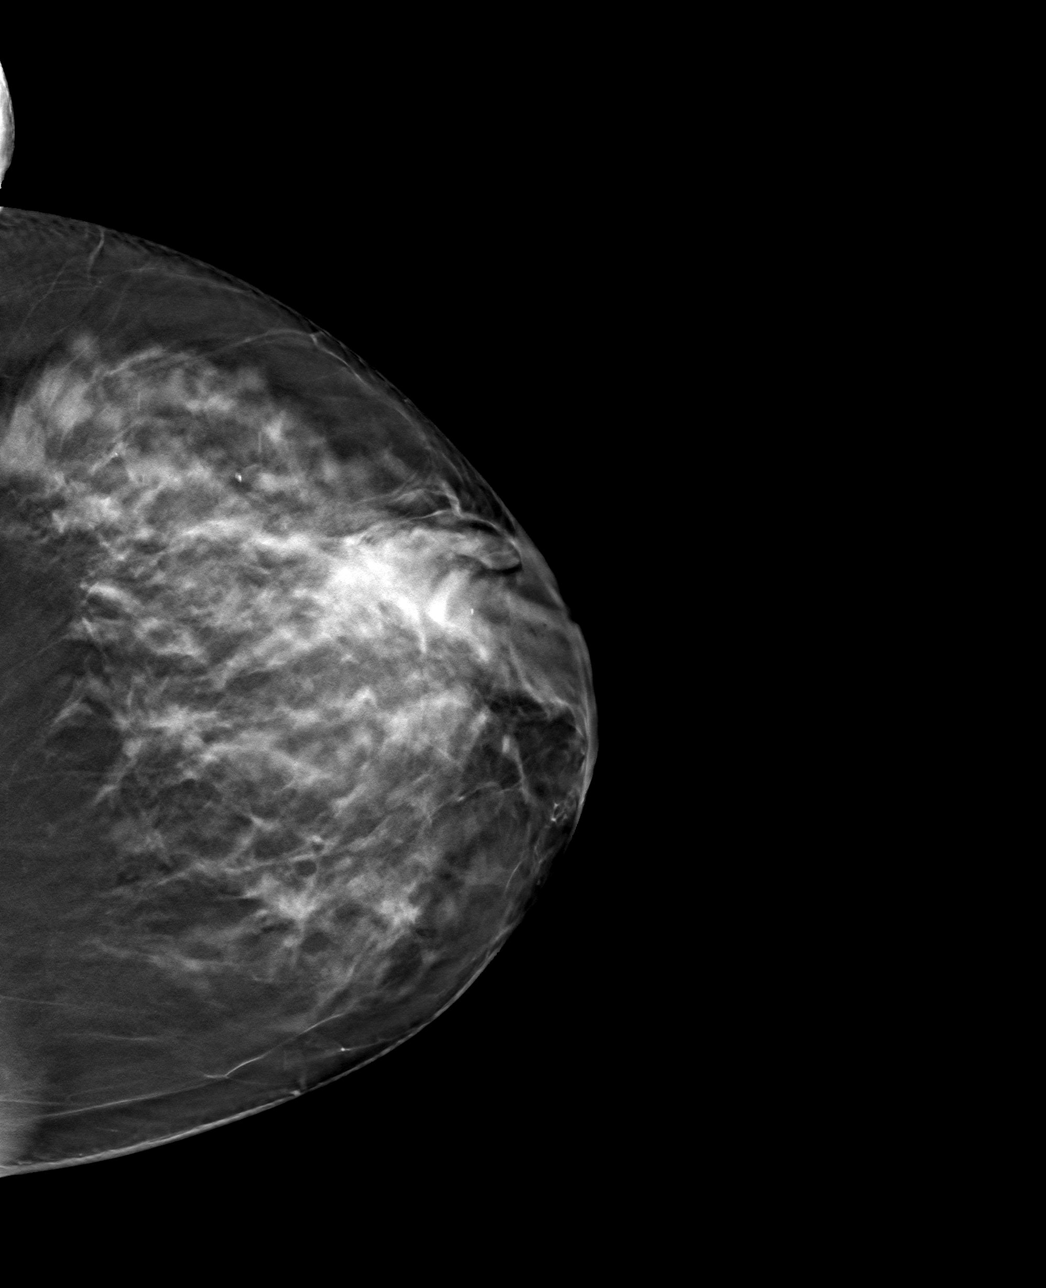

[4 of 12 positions shown; findings below may reference images not displayed]

FINDINGS: 3D Mammographic images were obtained following ultrasound guided
biopsy of the 1.8 cm intraductal OUTER LEFT breast mass. The spiral
HydroMARK biopsy marking clip is in expected position at the site of
biopsy.
IMPRESSION: Appropriate positioning of the spiral HydroMARK shaped biopsy
marking clip at the site of biopsy in the anterior OUTER LEFT
breast.

Final Assessment: Post Procedure Mammograms for Marker Placement

## 2022-07-04 ENCOUNTER — Ambulatory Visit: Payer: Medicare Other | Attending: Interventional Cardiology

## 2022-07-04 DIAGNOSIS — I340 Nonrheumatic mitral (valve) insufficiency: Secondary | ICD-10-CM | POA: Diagnosis not present

## 2022-07-04 DIAGNOSIS — E785 Hyperlipidemia, unspecified: Secondary | ICD-10-CM

## 2022-07-04 DIAGNOSIS — I480 Paroxysmal atrial fibrillation: Secondary | ICD-10-CM | POA: Diagnosis not present

## 2022-07-04 DIAGNOSIS — I10 Essential (primary) hypertension: Secondary | ICD-10-CM | POA: Diagnosis not present

## 2022-07-05 ENCOUNTER — Telehealth: Payer: Self-pay

## 2022-07-05 LAB — BASIC METABOLIC PANEL
BUN/Creatinine Ratio: 13 (ref 12–28)
BUN: 12 mg/dL (ref 8–27)
CO2: 22 mmol/L (ref 20–29)
Calcium: 9.9 mg/dL (ref 8.7–10.3)
Chloride: 98 mmol/L (ref 96–106)
Creatinine, Ser: 0.91 mg/dL (ref 0.57–1.00)
Glucose: 94 mg/dL (ref 70–99)
Potassium: 3.9 mmol/L (ref 3.5–5.2)
Sodium: 138 mmol/L (ref 134–144)
eGFR: 65 mL/min/{1.73_m2} (ref >59)

## 2022-07-05 NOTE — Telephone Encounter (Signed)
The patient has been notified of the result and verbalized understanding.  All questions (if any) were answered.     

## 2022-07-05 NOTE — Telephone Encounter (Signed)
-----   Message from Marylu Lund., NP sent at 07/05/2022  7:14 AM EDT ----- Please let Ms. Milberger know that her renal function and electrolytes are both stable.  Continue your current medication as prescribed and please let me know if you have any questions.  Ambrose Pancoast, NP

## 2022-07-13 ENCOUNTER — Encounter (HOSPITAL_COMMUNITY): Payer: Self-pay | Admitting: Nurse Practitioner

## 2022-07-13 ENCOUNTER — Ambulatory Visit (HOSPITAL_COMMUNITY)
Admission: RE | Admit: 2022-07-13 | Discharge: 2022-07-13 | Disposition: A | Discharge status: Home / Self Care | Payer: Medicare Other | Source: Ambulatory Visit | Attending: Nurse Practitioner | Admitting: Nurse Practitioner

## 2022-07-13 VITALS — BP 166/80 | HR 85 | Ht 66.5 in | Wt 191.8 lb

## 2022-07-13 DIAGNOSIS — I480 Paroxysmal atrial fibrillation: Secondary | ICD-10-CM | POA: Diagnosis not present

## 2022-07-13 DIAGNOSIS — I4891 Unspecified atrial fibrillation: Secondary | ICD-10-CM | POA: Insufficient documentation

## 2022-07-13 DIAGNOSIS — D6869 Other thrombophilia: Secondary | ICD-10-CM | POA: Diagnosis not present

## 2022-07-13 DIAGNOSIS — E041 Nontoxic single thyroid nodule: Secondary | ICD-10-CM | POA: Diagnosis not present

## 2022-07-13 DIAGNOSIS — I4819 Other persistent atrial fibrillation: Secondary | ICD-10-CM

## 2022-07-13 NOTE — Progress Notes (Signed)
Primary Care Physician: Hoyt Koch, MD Referring Physician: Ambrose Pancoast, NP   Olivia Mendoza is a 76 y.o. female being seen in the afib clinic for evaluation of persistent afib.   Olivia Mendoza was last seen by Dr. Irish Lack on 04/20/2022 for preoperative clearance.  She was found to have new onset atrial fibrillation and breast surgery was postponed and patient was started on Eliquis 5 mg and metoprolol for rate control.  2D echo was ordered and showed normal LV RV function with mild to moderate MR regurg and no aortic stenosis.  She was recommended to follow-up annually for echo for monitoring of valve disease.  She was able to reschedule surgery following echo results and was instructed to hold Eliquis for 2 days prior to procedure.  Plan was to restart Eliquis and potentially have elective cardioversion following 3 weeks.  Patient was seen in follow-up on 05/18/2022 for pre-DCCV visit.  Patient had an EKG completed that verified atrial fibrillation was still present.  She underwent a successful cardioversion but unfortunately has had ERAF.  In the clinic today, she remains out of rhythm and rate controlled, she states that she last had an EKG showing SR February of 2022 and she is unsure of when she developed afib. She was not symptomatic with it unit she started metoprolol, which made her feel winded and fatigued. She had an echo that showed normal LV/RV function with mild to mod MR, mod aortic regurgitation, her left atrium was reported as mildly dilated but measured LA diameter of 5.50 cm and 66.2 ml volume.    Today, she denies symptoms of palpitations, chest pain, shortness of breath, orthopnea, PND, lower extremity edema, dizziness, presyncope, syncope, or neurologic sequela. The patient is tolerating medications without difficulties and is otherwise without complaint today.   Past Medical History:  Diagnosis Date   Dysphagia    Fibrocystic breast disease    GERD (gastroesophageal  reflux disease)    History of cystopexy    History of migraines    Hyperlipemia    IBS (irritable bowel syndrome)    Mononucleosis    MVP (mitral valve prolapse)    Positional vertigo    PVC (premature ventricular contraction)    Routine general medical examination at a health care facility    Past Surgical History:  Procedure Laterality Date   BIOPSY THYROID     BREAST CYST ASPIRATION     CARDIOVERSION N/A 05/29/2022   Procedure: CARDIOVERSION;  Surgeon: Elouise Munroe, MD;  Location: [facility];  Service: Cardiovascular;  Laterality: N/A;   LAPAROSCOPIC TUBAL LIGATION     Bilateral   VAGINAL HYSTERECTOMY  92    Current Outpatient Medications  Medication Sig Dispense Refill   apixaban (ELIQUIS) 5 MG TABS tablet Take 1 tablet (5 mg total) by mouth 2 (two) times daily. 60 tablet 6   benzonatate (TESSALON) 200 MG capsule Take 200 mg by mouth 2 (two) times daily as needed for cough.     Cholecalciferol (VITAMIN D) 50 MCG (2000 UT) CAPS Take 2,000 Units by mouth daily.     Cyanocobalamin (B-12) 1000 MCG TABS Take 1,000 mcg by mouth daily.     estradiol (VIVELLE-DOT) 0.1 MG/24HR patch Place 0.5 patches onto the skin 2 (two) times a week.     famotidine (PEPCID) 20 MG tablet Take 20 mg by mouth daily.     Flaxseed, Linseed, (FLAX SEED OIL PO) Take 15 mLs by mouth daily.     fluticasone (FLONASE) 50  MCG/ACT nasal spray Place 2 sprays into both nostrils daily as needed for allergies or rhinitis.     hydrochlorothiazide (MICROZIDE) 12.5 MG capsule Take 1 capsule (12.5 mg total) by mouth daily. 90 capsule 3   loratadine (CLARITIN) 10 MG tablet Take 10 mg by mouth daily.     meclizine (ANTIVERT) 12.5 MG tablet Take 1 tablet (12.5 mg total) by mouth 3 (three) times daily as needed for dizziness. 30 tablet 3   metoprolol tartrate (LOPRESSOR) 25 MG tablet Take 1 tablet (25 mg total) by mouth 2 (two) times daily. 180 tablet 2   polyethylene glycol (MIRALAX / GLYCOLAX) 17 g packet Take 17 g  by mouth See admin instructions. Mon, Tu Wed Th and Sat ONLY     scopolamine (TRANSDERM-SCOP) 1 MG/3DAYS Place 1 patch onto the skin every 3 (three) days as needed (Nausea).     simvastatin (ZOCOR) 20 MG tablet TAKE 1 TABLET BY MOUTH EVERYDAY AT BEDTIME 90 tablet 3   No current facility-administered medications for this encounter.    Allergies  Allergen Reactions   Morphine And Related Hives and Nausea And Vomiting   Penicillins    Zetia [Ezetimibe]     Made IBS worse    Social History   Socioeconomic History   Marital status: Divorced    Spouse name: Not on file   Number of children: Not on file   Years of education: Not on file   Highest education level: Not on file  Occupational History   Occupation: [employer]  Tobacco Use   Smoking status: Never   Smokeless tobacco: Never  Substance and Sexual Activity   Alcohol use: Never   Drug use: Never   Sexual activity: Not on file  Other Topics Concern   Not on file  Social History Narrative   [employer], [employer].  Married 46yr - divorced.  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 Work - continues to work for [employer]            Social Determinants of Health   Financial Resource Strain: Low Risk  (10/16/2021)   Overall Financial Resource Strain (CARDIA)    Difficulty of Paying Living Expenses: Not hard at all  Food Insecurity: No Food Insecurity (10/16/2021)   Hunger Vital Sign    Worried About Running Out of Food in the Last Year: Never true    Ran Out of Food in the Last Year: Never true  Transportation Needs: No Transportation Needs (10/16/2021)   PRAPARE - THydrologist(Medical): No    Lack of Transportation (Non-Medical): No  Physical Activity: Sufficiently Active (10/16/2021)   Exercise Vital Sign    Days of Exercise per Week: 5 days    Minutes of Exercise per Session: 30 min  Stress:  No Stress Concern Present (10/16/2021)   [place]   Feeling of Stress : Not at all  Social Connections: Moderately Integrated (10/16/2021)   Social Connection and Isolation Panel [NHANES]    Frequency of Communication with Friends and Family: More than three times a week    Frequency of Social Gatherings with Friends and Family: More than three times a week    Attends Religious Services: More than 4 times per year    Active Member of [employer] Organizations: Yes    Attends CArchivistMeetings: More  than 4 times per year    Marital Status: Widowed  Intimate Partner Violence: Not At Risk (10/16/2021)   Humiliation, Afraid, Rape, and Kick questionnaire    Fear of Current or Ex-Partner: No    Emotionally Abused: No    Physically Abused: No    Sexually Abused: No    Family History  Problem Relation Age of Onset   Hypertension Mother    Hyperlipidemia Mother    Stroke Mother    Hiatal hernia Mother    Arthritis Mother    Heart disease Mother        PTVDP-ss syndrome (Dr Elisabeth Cara)   Hemochromatosis Father    Hyperlipidemia Father    Hypertension Father    Heart disease Father        CAD/CABG   Heart disease Brother        AAA Stent, PVD s/p stent, CAD, lipids   Breast cancer Maternal Grandmother 92    ROS- All systems are reviewed and negative except as per the HPI above  Physical Exam: Vitals:   07/13/22 1139  Weight: 87 kg  Height: 5' 6.5" (1.689 m)   Wt Readings from Last 3 Encounters:  07/13/22 87 kg  06/27/22 87.1 kg  05/29/22 86.6 kg    Labs: Lab Results  Component Value Date   NA 138 07/04/2022   K 3.9 07/04/2022   CL 98 07/04/2022   CO2 22 07/04/2022   GLUCOSE 94 07/04/2022   BUN 12 07/04/2022   CREATININE 0.91 07/04/2022   CALCIUM 9.9 07/04/2022   No results found for: "INR" Lab Results  Component Value Date   CHOL 201 (H) 10/19/2021   HDL 56.60 10/19/2021   LDLCALC 125 (H)  08/25/2015   TRIG 315.0 (H) 10/19/2021     GEN- The patient is well appearing, alert and oriented x 3 today.   Head- normocephalic, atraumatic Eyes-  Sclera clear, conjunctiva pink Ears- hearing intact Oropharynx- clear Neck- supple, no JVP Lymph- no cervical lymphadenopathy Lungs- Clear to ausculation bilaterally, normal work of breathing Heart- irregular rate and rhythm, no murmurs, rubs or gallops, PMI not laterally displaced GI- soft, NT, ND, + BS Extremities- no clubbing, cyanosis, or edema MS- no significant deformity or atrophy Skin- no rash or lesion Psych- euthymic mood, full affect Neuro- strength and sensation are intact  EKG-afib at 85 bpm, qrs int 82 ms, qtc 433 ms     Assessment and Plan:  1. Afib  First dx in August of this year but duration unknown Pt was not symptomatic with afib until she started BB which caused being winded and fatigue She did not see any change  with return of SR for a few days after cardioversion  We did discuss options to restore SR She states that she cannot afford Multaq with already being in the donut hole  She would need a stress test if flecainide was used We discussed amio but pt  not wanting to start drug for potential S.E. 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 Pending lumpectomy Per surgeon States not emergent to have  removed  Currently not cancerous but has potential to become malignant  Butch Penny C. Joliene Salvador, [facility] [address] [place], [place] [address] [phone]

## 2022-07-16 ENCOUNTER — Other Ambulatory Visit: Payer: Self-pay | Admitting: Internal Medicine

## 2022-07-30 NOTE — Progress Notes (Unsigned)
Office Visit    Patient Name: Olivia Mendoza Date of Encounter: 07/30/2022  Primary Care Provider:  Hoyt Koch, MD Primary Cardiologist:  Larae Grooms, MD Primary Electrophysiologist: None  Chief Complaint    Olivia Mendoza is a 76 y.o. female with PMH of HLD, aortic stenosis, paroxysmal AF, PVCs, MVP, who presents today for 1 month follow-up of atrial fibrillation.  Past Medical History    Past Medical History:  Diagnosis Date   Dysphagia    Fibrocystic breast disease    GERD (gastroesophageal reflux disease)    History of cystopexy    History of migraines    Hyperlipemia    IBS (irritable bowel syndrome)    Mononucleosis    MVP (mitral valve prolapse)    Positional vertigo    PVC (premature ventricular contraction)    Routine general medical examination at a health care facility    Past Surgical History:  Procedure Laterality Date   BIOPSY THYROID     BREAST CYST ASPIRATION     CARDIOVERSION N/A 05/29/2022   Procedure: CARDIOVERSION;  Surgeon: Elouise Munroe, MD;  Location: [facility];  Service: Cardiovascular;  Laterality: N/A;   LAPAROSCOPIC TUBAL LIGATION     Bilateral   VAGINAL HYSTERECTOMY  92    Allergies  Allergies  Allergen Reactions   Morphine And Related Hives and Nausea And Vomiting   Penicillins    Zetia [Ezetimibe]     Made IBS worse    History of Present Illness    Olivia Mendoza is a 76 year old female with the above-mentioned past medical history who presents today for follow-up of atrial fibrillation.  Olivia Mendoza was last seen by Dr. Irish Lack on 04/20/2022 for preoperative clearance.  She was found to have new onset atrial fibrillation and surgery was postponed and patient was started on Eliquis 5 mg and metoprolol for rate control.  2D echo was ordered and showed normal LV RV function with mild to moderate MR regurg and no aortic stenosis.  She was recommended to follow-up annually for echo for monitoring of valve  disease.  She was able to reschedule surgery following echo results and was instructed to hold Eliquis for 2 days prior to procedure.  Plan is to restart Eliquis and potentially have elective cardioversion following 3 weeks.  Patient was seen in follow-up on 05/18/2022 for pre-DCCV visit.  Patient had an EKG completed that verified atrial fibrillation was still present.  She underwent DCCV and was converted to sinus rhythm.  Olivia Mendoza presents today for follow-up alone.  Since last being seen in the office patient reports that she was recently seen in the AF clinic and otherwise has been doing well.  Her blood pressures are still not well-controlled and was 158/76.  Repeat blood pressure was not obtained during visit.  EKG was completed and patient was in atrial fibs atrial flutter with rate controlled at 91 bpm.  She is compliant with her current medications and denies any adverse reactions.  We discussed titrating her rate control medications however she is hesitant to do so at this time due to possible adverse reaction with beta-blocker.  She is scheduled to follow-up with the EP on 12/12 to discuss further treatment options.  Patient denies chest pain, palpitations, dyspnea, PND, orthopnea, nausea, vomiting, dizziness, syncope, edema, weight gain, or early satiety.    Home Medications    Current Outpatient Medications  Medication Sig Dispense Refill   apixaban (ELIQUIS) 5 MG TABS tablet Take 1 tablet (5  mg total) by mouth 2 (two) times daily. 60 tablet 6   benzonatate (TESSALON) 200 MG capsule Take 200 mg by mouth 2 (two) times daily as needed for cough.     Cholecalciferol (VITAMIN D) 50 MCG (2000 UT) CAPS Take 2,000 Units by mouth daily.     Cyanocobalamin (B-12) 1000 MCG TABS Take 1,000 mcg by mouth daily.     estradiol (VIVELLE-DOT) 0.1 MG/24HR patch Place 0.5 patches onto the skin 2 (two) times a week.     famotidine (PEPCID) 20 MG tablet Take 20 mg by mouth daily.     Flaxseed, Linseed,  (FLAX SEED OIL PO) Take 15 mLs by mouth daily.     fluticasone (FLONASE) 50 MCG/ACT nasal spray Place 2 sprays into both nostrils daily as needed for allergies or rhinitis.     hydrochlorothiazide (MICROZIDE) 12.5 MG capsule Take 1 capsule (12.5 mg total) by mouth daily. 90 capsule 3   loratadine (CLARITIN) 10 MG tablet Take 10 mg by mouth daily.     meclizine (ANTIVERT) 12.5 MG tablet Take 1 tablet (12.5 mg total) by mouth 3 (three) times daily as needed for dizziness. 30 tablet 3   metoprolol tartrate (LOPRESSOR) 25 MG tablet Take 1 tablet (25 mg total) by mouth 2 (two) times daily. 180 tablet 2   polyethylene glycol (MIRALAX / GLYCOLAX) 17 g packet Take 17 g by mouth See admin instructions. Mon, Tu Wed Th and Sat ONLY     scopolamine (TRANSDERM-SCOP) 1 MG/3DAYS Place 1 patch onto the skin every 3 (three) days as needed (Nausea).     simvastatin (ZOCOR) 20 MG tablet TAKE 1 TABLET BY MOUTH EVERYDAY AT BEDTIME 90 tablet 3   No current facility-administered medications for this visit.     Review of Systems  Please see the history of present illness.    (+) Increased fatigue (+) Shortness of breath with activity  All other systems reviewed and are otherwise negative except as noted above.  Physical Exam    Wt Readings from Last 3 Encounters:  07/13/22 191 lb 12.8 oz (87 kg)  06/27/22 192 lb (87.1 kg)  05/29/22 191 lb (86.6 kg)   NL:ZJQBH were no vitals filed for this visit.,There is no height or weight on file to calculate BMI.  Constitutional:      Appearance: Healthy appearance. Not in distress.  Neck:     Vascular: JVD normal.  Pulmonary:     Effort: Pulmonary effort is normal.     Breath sounds: No wheezing. No rales. Diminished in the bases Cardiovascular:     Irregularly irregular normal S1. Normal S2.      Murmurs: 3/6 holosystolic murmur  Edema:    Peripheral edema absent.  Abdominal:     Palpations: Abdomen is soft non tender. There is no hepatomegaly.  Skin:     General: Skin is warm and dry.  Neurological:     General: No focal deficit present.     Mental Status: Alert and oriented to person, place and time.     Cranial Nerves: Cranial nerves are intact.  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 The patient's score is based upon: CHF History: 0 HTN History: 0 Diabetes History: 0 Stroke History: 0 Vascular  Disease History: 0 Age Score: 2 Gender Score: 1           Lab Results  Component Value Date   WBC 6.2 05/23/2022   HGB 15.7 05/23/2022   HCT 46.4 05/23/2022   MCV 89 05/23/2022   PLT 312 05/23/2022   Lab Results  Component Value Date   CREATININE 0.91 07/04/2022   BUN 12 07/04/2022   NA 138 07/04/2022   K 3.9 07/04/2022   CL 98 07/04/2022   CO2 22 07/04/2022   Lab Results  Component Value Date   ALT 20 10/19/2021   AST 22 10/19/2021   ALKPHOS 80 10/19/2021   BILITOT 1.9 (H) 10/19/2021   Lab Results  Component Value Date   CHOL 201 (H) 10/19/2021   HDL 56.60 10/19/2021   LDLCALC 125 (H) 08/25/2015   LDLDIRECT 118.0 10/19/2021   TRIG 315.0 (H) 10/19/2021   CHOLHDL 4 10/19/2021    Lab Results  Component Value Date   HGBA1C 5.5 10/19/2021    Assessment & Plan    1.  Paroxysmal atrial fibrillation: -DCCV completed on 9/19 and was cardioverted to normal sinus rhythm however converted to atrial flutter 2 days later -Continue lopressor 25 mg twice daily -She is scheduled to follow-up with EP on 12/12 for further treatment options. -Patient reports no bleeding and creatinine was 0.9 and hemoglobin was 16.3 -Continue Eliquis 5 mg twice daily -CHA2DS2-VASc Score = 3 [CHF History: 0, HTN History: 0, Diabetes History: 0, Stroke History: 0, Vascular Disease History: 0, Age  Score: 2, Gender Score: 1].  Therefore, the patient's annual risk of stroke is 3.2 %.       2.  Mitral regurgitation: -Regurgitation on most previous 2D echo showed moderate to mild with recommendation of annual follow-up for valve disease -Patient is euvolemic on examination -Blood pressure today was elevated at 158/76  -I advised her to check blood pressures over the next week and report back to the office.  If blood pressures remain elevated we will increase beta-blocker or add additional therapy.   3.  Hyperlipidemia: -Patient's last LDL was 118 above goal of less than 70 -Currently followed by PCP -Continue Zocor 20 mg daily   4.  Essential hypertension: -Patient's blood pressure today was elevated at 158/76 -We will increase HCTZ to 25 mg today -BMET in 2 week -Patient was advised to check blood pressures over the next 2 weeks and report readings back to our office. -Ambulatory referral to Pharm.D. for blood pressure titration and management   Disposition: Follow-up with Larae Grooms, MD or APP in 3 months   Medication Adjustments/Labs and Tests Ordered: Current medicines are reviewed at length with the patient today.  Concerns regarding medicines are outlined above.   Signed, Mable Fill, Marissa Nestle, NP 07/30/2022, 12:08 PM [facility]  Note:  This document was prepared using Dragon voice recognition software and may include unintentional dictation errors.

## 2022-07-31 ENCOUNTER — Encounter: Payer: Self-pay | Admitting: Nurse Practitioner

## 2022-07-31 ENCOUNTER — Ambulatory Visit: Payer: Medicare Other | Attending: Nurse Practitioner | Admitting: Nurse Practitioner

## 2022-07-31 VITALS — BP 158/76 | HR 91 | Ht 66.5 in | Wt 192.0 lb

## 2022-07-31 DIAGNOSIS — I10 Essential (primary) hypertension: Secondary | ICD-10-CM | POA: Diagnosis not present

## 2022-07-31 DIAGNOSIS — I340 Nonrheumatic mitral (valve) insufficiency: Secondary | ICD-10-CM | POA: Diagnosis not present

## 2022-07-31 DIAGNOSIS — I4819 Other persistent atrial fibrillation: Secondary | ICD-10-CM | POA: Insufficient documentation

## 2022-07-31 DIAGNOSIS — E785 Hyperlipidemia, unspecified: Secondary | ICD-10-CM | POA: Diagnosis not present

## 2022-07-31 MED ORDER — HYDROCHLOROTHIAZIDE 25 MG PO TABS: 25.0000 mg | ORAL_TABLET | Freq: Every day | ORAL | 1 refills | Status: DC

## 2022-07-31 NOTE — Patient Instructions (Addendum)
Medication Instructions:  INCREASE Hydrochlorothiazide to '25mg'$  Take 1 tablet daily *If you need a refill on your cardiac medications before your next appointment, please call your pharmacy*   Lab Work: BMET ON 08/21/22 (with other appointment) If you have labs (blood work) drawn today and your tests are completely normal, you will receive your results only by: [place] (if you have MyChart) OR A paper copy in the mail If you have any lab test that is abnormal or we need to change your treatment, we will call you to review the results.   Testing/Procedures: None Ordered   Follow-Up: At [facility], you and your health needs are our priority.  As part of our continuing mission to provide you with exceptional heart care, we have created designated Provider Care Teams.  These Care Teams include your primary Cardiologist (physician) and Advanced Practice Providers (APPs -  Physician Assistants and Nurse Practitioners) who all work together to provide you with the care you need, when you need it.  We recommend signing up for the patient portal called "MyChart".  Sign up information is provided on this After Visit Summary.  MyChart is used to connect with patients for Virtual Visits (Telemedicine).  Patients are able to view lab/test results, encounter notes, upcoming appointments, etc.  Non-urgent messages can be sent to your provider as well.   To learn more about what you can do with MyChart, go to [email or web address].    Your next appointment:   3 month(s)  The format for your next appointment:   In Person  Provider:   Larae Grooms, MD     Your physician recommends that you schedule a follow-up appointment in: 2 weeks with PharmD (med titrration & mgmt) Other Instructions   Important Information About Sugar

## 2022-08-07 ENCOUNTER — Telehealth: Payer: Self-pay | Admitting: Pharmacist

## 2022-08-07 NOTE — Telephone Encounter (Signed)
Pt returned call, appt moved to 12/6.

## 2022-08-07 NOTE — Telephone Encounter (Addendum)
Left message for pt to move PharmD appt from 12/7 to 12/6 due to scheduling.

## 2022-08-14 ENCOUNTER — Ambulatory Visit: Payer: Medicare Other

## 2022-08-15 ENCOUNTER — Ambulatory Visit: Payer: Medicare Other | Attending: Interventional Cardiology | Admitting: Pharmacist

## 2022-08-15 VITALS — BP 162/60 | HR 84

## 2022-08-15 DIAGNOSIS — I480 Paroxysmal atrial fibrillation: Secondary | ICD-10-CM | POA: Diagnosis not present

## 2022-08-15 DIAGNOSIS — I10 Essential (primary) hypertension: Secondary | ICD-10-CM | POA: Insufficient documentation

## 2022-08-15 MED ORDER — AMLODIPINE BESYLATE 2.5 MG PO TABS: 2.5000 mg | ORAL_TABLET | Freq: Every day | ORAL | 3 refills | Status: DC

## 2022-08-15 NOTE — Assessment & Plan Note (Signed)
Assessment: Blood pressure not controlled in the office or at home Home cuff has not been vaildated. She was asked to bring it in to next visit. Technique reviewed and sounds correct No physical activity, limited by SOB due to afib? Seeing EP next week about options to covert to NSR Diet sounds fairly high in Na. Lives alone, doesn't like to cook much just for herself No caffeine Wants to get repeat BMP next week when she sees EP- orders and apt already scheduled  Plan: Discussed CCB vs ARB. Will start amlodipine 2.'5mg'$  once a day Continue HCTZ '25mg'$  daily and metoprolol tartrate '25mg'$  BID Increase physical activity and limit Na Check BP at home 1-2 times a day. Write down readings and bring log and BP cuff to next visit Follow up in Jan (next available 1/11)

## 2022-08-15 NOTE — Progress Notes (Signed)
Patient ID: Olivia Mendoza                 DOB: 08-12-1946                      MRN: [id number]      HPI: Olivia Mendoza is a 76 y.o. female patient of Dr. Irish Mendoza referred by Dr. Ambrose Pancoast, NP to HTN clinic. PMH is significant for HLD, aortic stenosis, paroxysmal AF, PVCs, MVP and HTN. Was seen by Olivia Mendoza on 07/31/22. BP was 158/76. HCTZ was increased from 12.'5mg'$  daily to '25mg'$  daily.   Patient presents today for follow up. Reports home blood pressures in the 140's on a good day, 150's other days. Denies dizziness/lightheadedness unless she is rushing to shower and get ready. Denies headaches or swelling. She gets SOB walking. Easily fatigued. Thinks its from metoprolol. We discussed that it is most likely from her afib and a little from metoprolol. Does not exercise.    Current HTN meds: hydrochlorothiazide '25mg'$  daily, metoprolol tartrate '25mg'$  twice a day Previously tried:  BP goal: <130/80  Family History:  Family History  Problem Relation Age of Onset   Hypertension Mother    Hyperlipidemia Mother    Stroke Mother    Hiatal hernia Mother    Arthritis Mother    Heart disease Mother        PTVDP-ss syndrome (Dr Olivia Mendoza)   Hemochromatosis Father    Hyperlipidemia Father    Hypertension Father    Heart disease Father        CAD/CABG   Heart disease Brother        AAA Stent, PVD s/p stent, CAD, lipids   Breast cancer Maternal Grandmother 92    Social History: no ETOH, no tobacco  Diet: none  Exercise:  Breakfast: bran cereal w/ milk, oranges Lunch: sandwich w/ rotisserie chicken, tomato soup, pimento cheese sandwich Dinner: salad once in awhile, spagetti with meat sauce (canned), frozen rice dishes Snacks: fruit Drinks: water, caffeine free pepsi 1 per day, zero sprit   Home BP readings: 157/82, good reading 147  Wt Readings from Last 3 Encounters:  07/31/22 192 lb (87.1 kg)  07/13/22 191 lb 12.8 oz (87 kg)  06/27/22 192 lb (87.1 kg)   BP Readings from Last 3  Encounters:  08/15/22 (!) 162/60  07/31/22 (!) 158/76  07/13/22 (!) 166/80   Pulse Readings from Last 3 Encounters:  08/15/22 84  07/31/22 91  07/13/22 85    Renal function: CrCl cannot be calculated (Patient's most recent lab result is older than the maximum 21 days allowed.).  Past Medical History:  Diagnosis Date   Dysphagia    Fibrocystic breast disease    GERD (gastroesophageal reflux disease)    History of cystopexy    History of migraines    Hyperlipemia    IBS (irritable bowel syndrome)    Mononucleosis    MVP (mitral valve prolapse)    Positional vertigo    PVC (premature ventricular contraction)    Routine general medical examination at a health care facility     Current Outpatient Medications on File Prior to Visit  Medication Sig Dispense Refill   apixaban (ELIQUIS) 5 MG TABS tablet Take 1 tablet (5 mg total) by mouth 2 (two) times daily. 60 tablet 6   benzonatate (TESSALON) 200 MG capsule Take 200 mg by mouth 2 (two) times daily as needed for cough.     Cholecalciferol (VITAMIN D) 50 MCG (2000 UT)  CAPS Take 2,000 Units by mouth daily.     Cyanocobalamin (B-12) 1000 MCG TABS Take 1,000 mcg by mouth daily.     estradiol (VIVELLE-DOT) 0.1 MG/24HR patch Place 0.5 patches onto the skin 2 (two) times a week.     famotidine (PEPCID) 20 MG tablet Take 20 mg by mouth daily.     Flaxseed, Linseed, (FLAX SEED OIL PO) Take 15 mLs by mouth daily.     fluticasone (FLONASE) 50 MCG/ACT nasal spray Place 2 sprays into both nostrils daily as needed for allergies or rhinitis.     hydrochlorothiazide (HYDRODIURIL) 25 MG tablet Take 1 tablet (25 mg total) by mouth daily. 90 tablet 1   loratadine (CLARITIN) 10 MG tablet Take 10 mg by mouth daily.     meclizine (ANTIVERT) 12.5 MG tablet Take 1 tablet (12.5 mg total) by mouth 3 (three) times daily as needed for dizziness. 30 tablet 3   metoprolol tartrate (LOPRESSOR) 25 MG tablet Take 1 tablet (25 mg total) by mouth 2 (two) times  daily. 180 tablet 2   polyethylene glycol (MIRALAX / GLYCOLAX) 17 g packet Take 17 g by mouth See admin instructions. Mon, Tu Wed Th and Sat ONLY     scopolamine (TRANSDERM-SCOP) 1 MG/3DAYS Place 1 patch onto the skin every 3 (three) days as needed (Nausea).     simvastatin (ZOCOR) 20 MG tablet TAKE 1 TABLET BY MOUTH EVERYDAY AT BEDTIME 90 tablet 3   No current facility-administered medications on file prior to visit.    Allergies  Allergen Reactions   Morphine And Related Hives and Nausea And Vomiting   Penicillins    Zetia [Ezetimibe]     Made IBS worse    Blood pressure (!) 162/60, pulse 84.   Assessment/Plan:  HYPERTENSION CONTROL Vitals:   08/15/22 1550 08/15/22 1551  BP: (!) 160/82 (!) 162/60    The patient's blood pressure is elevated above target today.  In order to address the patient's elevated BP: A new medication was prescribed today.      1. Hypertension -   Hypertension Assessment: Blood pressure not controlled in the office or at home Home cuff has not been vaildated. She was asked to bring it in to next visit. Technique reviewed and sounds correct No physical activity, limited by SOB due to afib? Seeing EP next week about options to covert to NSR Diet sounds fairly high in Na. Lives alone, doesn't like to cook much just for herself No caffeine Wants to get repeat BMP next week when she sees EP- orders and apt already scheduled  Plan: Discussed CCB vs ARB. Will start amlodipine 2.'5mg'$  once a day Continue HCTZ '25mg'$  daily and metoprolol tartrate '25mg'$  BID Increase physical activity and limit Na Check BP at home 1-2 times a day. Write down readings and bring log and BP cuff to next visit Follow up in Jan (next available 1/11)  Atrial fibrillation ([place]) Pt assistance paperwork for Eliquis was filled out by patient. She gave to me to have Dr. Irish Mendoza sign and fax. He will be in the office tomorrow. Will have him sign and then I will fax over.   Thank  you  Olivia Mendoza, Pharm.D, BCPS, CPP [facility] of [facility] [address] [place], [place] [address]  Phone: [phone]; Fax: [phone]

## 2022-08-15 NOTE — Patient Instructions (Signed)
Summary of today's discussion  1.Start taking amlodipine 2.'5mg'$  daily  2.Continue hydrochlorothiazide '25mg'$  daily, metoprolol tartrate '25mg'$  twice a day  3. Try to increase your physical activity  4. Please check your blood pressure 1-2 times a day. Please bring your readings and blood pressure cuff with you to your next appointment  5. Watch the sodium content of your food. Limit to '2300mg'$  per day   Your blood pressure goal is <130/80  To check your pressure at home you will need to:  1. Sit up in a chair, with feet flat on the floor and back supported. Do not cross your ankles or legs. 2. Rest your left arm so that the cuff is about heart level. If the cuff goes on your upper arm,  then just relax the arm on the table, arm of the chair or your lap. If you have a wrist cuff, we  suggest relaxing your wrist against your chest (think of it as Pledging the Flag with the  wrong arm).  3. Place the cuff snugly around your arm, about 1 inch above the crook of your elbow. The  cords should be inside the groove of your elbow.  4. Sit quietly, with the cuff in place, for about 5 minutes. After that 5 minutes press the power  button to start a reading. 5. Do not talk or move while the reading is taking place.  6. Record your readings on a sheet of paper. Although most cuffs have a memory, it is often  easier to see a pattern developing when the numbers are all in front of you.  7. You can repeat the reading after 1-3 minutes if it is recommended  Make sure your bladder is empty and you have not had caffeine or tobacco within the last 30 min  Always bring your blood pressure log with you to your appointments. If you have not brought your monitor in to be double checked for accuracy, please bring it to your next appointment.  You can find a list of validated (accurate) blood pressure cuffs at [employer].it   Important lifestyle changes to control high blood pressure  Intervention  Effect on the  BP  Lose extra pounds and watch your waistline Weight loss is one of the most effective lifestyle changes for controlling blood pressure. If you're overweight or obese, losing even a small amount of weight can help reduce blood pressure. Blood pressure might go down by about 1 millimeter of mercury (mm Hg) with each kilogram (about 2.2 pounds) of weight lost.  Exercise regularly As a general goal, aim for at least 30 minutes of moderate physical activity every day. Regular physical activity can lower high blood pressure by about 5 to 8 mm Hg.  Eat a healthy diet Eating a diet rich in whole grains, fruits, vegetables, and low-fat dairy products and low in saturated fat and cholesterol. A healthy diet can lower high blood pressure by up to 11 mm Hg.  Reduce salt (sodium) in your diet Even a small reduction of sodium in the diet can improve heart health and reduce high blood pressure by about 5 to 6 mm Hg.  Limit alcohol One drink equals 12 ounces of beer, 5 ounces of wine, or 1.5 ounces of 80-proof liquor.  Limiting alcohol to less than one drink a day for women or two drinks a day for men can help lower blood pressure by about 4 mm Hg.   Adopting a Healthy Lifestyle.   Weight: Know  what a healthy weight is for you (roughly BMI <25) and aim to maintain this. You can calculate your body mass index on your smart phone  Diet: Aim for 7+ servings of fruits and vegetables daily Limit animal fats in diet for cholesterol and heart health - choose grass fed whenever available Avoid highly processed foods (fast food burgers, tacos, fried chicken, pizza, hot dogs, french fries)  Saturated fat comes in the form of butter, lard, coconut oil, margarine, partially hydrogenated oils, and fat in meat. These increase your risk of cardiovascular disease.  Use healthy plant oils, such as olive, canola, soy, corn, sunflower and peanut.  Whole foods such as fruits, vegetables and whole grains have fiber  Men need >  38 grams of fiber per day Women need > 25 grams of fiber per day  Load up on vegetables and fruits - one-half of your plate: Aim for color and variety, and remember that potatoes dont count. Go for whole grains - one-quarter of your plate: Whole wheat, barley, wheat berries, quinoa, oats, brown rice, and foods made with them. If you want pasta, go with whole wheat pasta. Protein power - one-quarter of your plate: Fish, chicken, beans, and nuts are all healthy, versatile protein sources. Limit red meat. You need carbohydrates for energy! The type of carbohydrate is more important than the amount. Choose carbohydrates such as vegetables, fruits, whole grains, beans, and nuts in the place of white rice, white pasta, potatoes (baked or fried), macaroni and cheese, cakes, cookies, and donuts.  If youre thirsty, drink water. Coffee and tea are good in moderation, but skip sugary drinks and limit milk and dairy products to one or two daily servings. Keep sugar intake at 6 teaspoons or 24 grams or LESS       Exercise: Aim for 150 min of moderate intensity exercise weekly for heart health, and weights twice weekly for bone health Stay active - any steps are better than no steps! Aim for 7-9 hours of sleep daily        Please call me at [phone] with any questions.

## 2022-08-15 NOTE — Assessment & Plan Note (Signed)
Pt assistance paperwork for Olivia Mendoza was filled out by patient. She gave to me to have Dr. Irish Lack sign and fax. He will be in the office tomorrow. Will have him sign and then I will fax over.

## 2022-08-21 ENCOUNTER — Ambulatory Visit (INDEPENDENT_AMBULATORY_CARE_PROVIDER_SITE_OTHER): Payer: Medicare Other

## 2022-08-21 ENCOUNTER — Ambulatory Visit: Payer: Medicare Other

## 2022-08-21 ENCOUNTER — Encounter: Payer: Self-pay | Admitting: Cardiovascular Disease

## 2022-08-21 ENCOUNTER — Ambulatory Visit: Payer: Medicare Other | Attending: Cardiovascular Disease | Admitting: Cardiovascular Disease

## 2022-08-21 VITALS — BP 130/84 | HR 85 | Ht 66.5 in | Wt 192.6 lb

## 2022-08-21 DIAGNOSIS — I4819 Other persistent atrial fibrillation: Secondary | ICD-10-CM

## 2022-08-21 DIAGNOSIS — I480 Paroxysmal atrial fibrillation: Secondary | ICD-10-CM | POA: Insufficient documentation

## 2022-08-21 NOTE — Progress Notes (Signed)
Electrophysiology Office Note:    Date:  08/21/2022   ID:  Olivia Mendoza, DOB 03-Jul-1946, MRN [id number]  PCP:  Hoyt Koch, MD   [facility] Cardiologist:  Larae Grooms, MD Electrophysiologist:  Melida Quitter, MD     Referring MD: Sherran Needs, NP   History of Present Illness:    Olivia Mendoza is a 76 y.o. female with a hx listed below, significant for persistent AF and PVCs, referred for arrhythmia management.  She was diagnosed with atrial fibrillation in August 2023 discovered incidentally during preoperative evaluation.  Metoprolol and Eliquis restarted.  She notices significant increase in fatigue and exertional intolerance that she attributes to starting metoprolol.  She underwent DC cardioversion on September 19 but had early recurrence of atrial fibrillation.   She has palpitations when she lies on her left side but otherwise is not aware that she is in atrial fibrillation.  She has been told that she has had PVCs for many years, and cannot differentiate the palpitations that she experiences now from PVCs that she has had in the past.  She does not have chest pain, but she does have fatigue as mentioned previously and occasional lightheadedness.  Past Medical History:  Diagnosis Date   Dysphagia    Fibrocystic breast disease    GERD (gastroesophageal reflux disease)    History of cystopexy    History of migraines    Hyperlipemia    IBS (irritable bowel syndrome)    Mononucleosis    MVP (mitral valve prolapse)    Positional vertigo    PVC (premature ventricular contraction)    Routine general medical examination at a health care facility     Past Surgical History:  Procedure Laterality Date   BIOPSY THYROID     BREAST CYST ASPIRATION     CARDIOVERSION N/A 05/29/2022   Procedure: CARDIOVERSION;  Surgeon: Elouise Munroe, MD;  Location: [facility] ENDOSCOPY;  Service: Cardiovascular;  Laterality: N/A;   LAPAROSCOPIC TUBAL  LIGATION     Bilateral   VAGINAL HYSTERECTOMY  89    Current Medications: Current Meds  Medication Sig   amLODipine (NORVASC) 2.5 MG tablet Take 1 tablet (2.5 mg total) by mouth daily.   apixaban (ELIQUIS) 5 MG TABS tablet Take 1 tablet (5 mg total) by mouth 2 (two) times daily.   benzonatate (TESSALON) 200 MG capsule Take 200 mg by mouth 2 (two) times daily as needed for cough.   Cholecalciferol (VITAMIN D) 50 MCG (2000 UT) CAPS Take 2,000 Units by mouth daily.   Cyanocobalamin (B-12) 1000 MCG TABS Take 1,000 mcg by mouth daily.   estradiol (VIVELLE-DOT) 0.1 MG/24HR patch Place 0.5 patches onto the skin 2 (two) times a week.   famotidine (PEPCID) 20 MG tablet Take 20 mg by mouth daily.   Flaxseed, Linseed, (FLAX SEED OIL PO) Take 15 mLs by mouth daily.   fluticasone (FLONASE) 50 MCG/ACT nasal spray Place 2 sprays into both nostrils daily as needed for allergies or rhinitis.   hydrochlorothiazide (HYDRODIURIL) 25 MG tablet Take 1 tablet (25 mg total) by mouth daily.   loratadine (CLARITIN) 10 MG tablet Take 10 mg by mouth daily.   meclizine (ANTIVERT) 12.5 MG tablet Take 1 tablet (12.5 mg total) by mouth 3 (three) times daily as needed for dizziness.   polyethylene glycol (MIRALAX / GLYCOLAX) 17 g packet Take 17 g by mouth See admin instructions. Mon, Tu Wed Th and Sat ONLY   scopolamine (TRANSDERM-SCOP) 1 MG/3DAYS Place 1  patch onto the skin every 3 (three) days as needed (Nausea).   simvastatin (ZOCOR) 20 MG tablet TAKE 1 TABLET BY MOUTH EVERYDAY AT BEDTIME   [DISCONTINUED] metoprolol tartrate (LOPRESSOR) 25 MG tablet Take 1 tablet (25 mg total) by mouth 2 (two) times daily.     Allergies:   Morphine and related, Penicillins, and Zetia [ezetimibe]   Social History   Socioeconomic History   Marital status: Divorced    Spouse name: Not on file   Number of children: Not on file   Years of education: Not on file   Highest education level: Not on file  Occupational History    Occupation: [employer]  Tobacco Use   Smoking status: Never   Smokeless tobacco: Never  Substance and Sexual Activity   Alcohol use: Never   Drug use: Never   Sexual activity: Not on file  Other Topics Concern   Not on file  Social History Narrative   [employer], [employer].  Married 33yr - divorced.  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 Work - continues to work for [employer]            Social Determinants of Health   Financial Resource Strain: Low Risk  (10/16/2021)   Overall Financial Resource Strain (CARDIA)    Difficulty of Paying Living Expenses: Not hard at all  Food Insecurity: No Food Insecurity (10/16/2021)   Hunger Vital Sign    Worried About Running Out of Food in the Last Year: Never true    Ran Out of Food in the Last Year: Never true  Transportation Needs: No Transportation Needs (10/16/2021)   PRAPARE - THydrologist(Medical): No    Lack of Transportation (Non-Medical): No  Physical Activity: Sufficiently Active (10/16/2021)   Exercise Vital Sign    Days of Exercise per Week: 5 days    Minutes of Exercise per Session: 30 min  Stress: No Stress Concern Present (10/16/2021)   FSmiley   Feeling of Stress : Not at all  Social Connections: Moderately Integrated (10/16/2021)   Social Connection and Isolation Panel [NHANES]    Frequency of Communication with Friends and Family: More than three times a week    Frequency of Social Gatherings with Friends and Family: More than three times a week    Attends Religious Services: More than 4 times per year    Active Member of [employer] Organizations: Yes    Attends CArchivistMeetings: More than 4 times per year    Marital Status: Widowed     Family History: The patient's family history includes Arthritis in her mother;  Breast cancer (age of onset: 961 in her maternal grandmother; Heart disease in her brother, father, and mother; Hemochromatosis in her father; Hiatal hernia in her mother; Hyperlipidemia in her father and mother; Hypertension in her father and mother; Stroke in her mother.  ROS:   Please see the history of present illness.    All other systems reviewed and are negative.  EKGs/Labs/Other Studies Reviewed Today:     TTE 05/01/2022: LVEF 60-65%, severely dilated LA by metrics, Mild-mod MVR, moderate AR  EKG:  Last EKG results: AF with controlled rates  I reviewed all ECGs dating back to Feb, 2022, which was the most recent Ecg that showed sinus rhythm.   Recent Labs: 10/19/2021: ALT  20 05/01/2022: TSH 3.240 05/23/2022: Hemoglobin 15.7; Platelets 312 07/04/2022: BUN 12; Creatinine, Ser 0.91; Potassium 3.9; Sodium 138     Physical Exam:    VS:  BP 130/84   Pulse 85   Ht 5' 6.5" (1.689 m)   Wt 192 lb 9.6 oz (87.4 kg)   SpO2 98%   BMI 30.62 kg/m     Wt Readings from Last 3 Encounters:  08/21/22 192 lb 9.6 oz (87.4 kg)  07/31/22 192 lb (87.1 kg)  07/13/22 191 lb 12.8 oz (87 kg)     GEN: Well nourished, well developed in no acute distress CARDIAC: Irregular rhythm, + murmur; no rubs, gallops RESPIRATORY:  Normal work of breathing MUSCULOSKELETAL: trace edema    ASSESSMENT & PLAN:    Atrial fibrillation: I suspect her symptoms of fatigue are due to atrial fibrillation more so than her current low-dose of metoprolol.       - DC metoprolol, place 3 day Ziopatch       - in follow-up, we will discuss whether her symptoms are improved off metoprolol and whether her rates are controlled. If she returns either asymptomatic or with poorly controlled rates, I think we will need to pursue rhythm control. Secondary hypercoagulable state: due to AF with CHADS2Vasc of at least 4 Hypertension: following in hypertension clinic, on HCTZ, metoprolol (will DC this visit), and amlodipine         Medication Adjustments/Labs and Tests Ordered: Current medicines are reviewed at length with the patient today.  Concerns regarding medicines are outlined above.  Orders Placed This Encounter  Procedures   LONG TERM MONITOR (3-14 DAYS)   EKG 12-Lead   No orders of the defined types were placed in this encounter.    Signed, Melida Quitter, MD  08/21/2022 2:41 PM    [place]

## 2022-08-21 NOTE — Patient Instructions (Signed)
Medication Instructions:  Your physician has recommended you make the following change in your medication:  1) STOP taking metoprolol  *If you need a refill on your cardiac medications before your next appointment, please call your pharmacy*  Testing/Procedures: Your physician has recommended that you wear an event monitor. Event monitors are medical devices that record the heart's electrical activity. Doctors most often [place] these monitors to diagnose arrhythmias. Arrhythmias are problems with the speed or rhythm of the heartbeat. The monitor is a small, portable device. You can wear one while you do your normal daily activities. This is usually used to diagnose what is causing palpitations/syncope (passing out).  Follow-Up: At [facility], you and your health needs are our priority.  As part of our continuing mission to provide you with exceptional heart care, we have created designated Provider Care Teams.  These Care Teams include your primary Cardiologist (physician) and Advanced Practice Providers (APPs -  Physician Assistants and Nurse Practitioners) who all work together to provide you with the care you need, when you need it.  Your next appointment:   Next available  The format for your next appointment:   In Person  Provider:   Doralee Albino, MD    [place] Instructions  Your physician has requested you wear a ZIO patch monitor for 3 days.  This is a single patch monitor. [employer] supplies one patch monitor per enrollment. Additional stickers are not available. Please do not apply patch if you will be having a Nuclear Stress Test,  Echocardiogram, Cardiac CT, MRI, or Chest Xray during the period you would be wearing the  monitor. The patch cannot be worn during these tests. You cannot remove and re-apply the  ZIO XT patch monitor.  Your ZIO patch monitor will be mailed 3 day USPS to your address on file. It may take 3-5 days  to receive your monitor  after you have been enrolled.  Once you have received your monitor, please review the enclosed instructions. Your monitor  has already been registered assigning a specific monitor serial # to you.  Billing and Patient Assistance Program Information  We have supplied [employer] with any of your insurance information on file for billing purposes. [employer] offers a sliding scale Patient Assistance Program for patients that do not have  insurance, or whose insurance does not completely cover the cost of the ZIO monitor.  You must apply for the Patient Assistance Program to qualify for this discounted rate.  To apply, please call [employer] at [phone], select option 4, select option 2, ask to apply for  Patient Assistance Program. Theodore Demark will ask your household income, and how many people  are in your household. They will quote your out-of-pocket cost based on that information.  [employer] will also be able to set up a 45-month interest-free payment plan if needed.  Applying the monitor   Shave hair from upper left chest.  Hold abrader disc by orange tab. Rub abrader in 40 strokes over the upper left chest as  indicated in your monitor instructions.  Clean area with 4 enclosed alcohol pads. Let dry.  Apply patch as indicated in monitor instructions. Patch will be placed under collarbone on left  side of chest with arrow pointing upward.  Rub patch adhesive wings for 2 minutes. Remove white label marked "1". Remove the white  label marked "2". Rub patch adhesive wings for 2 additional minutes.  While looking in a mirror, press and release button in  center of patch. A small green light will  flash 3-4 times. This will be your only indicator that the monitor has been turned on.  Do not shower for the first 24 hours. You may shower after the first 24 hours.  Press the button if you feel a symptom. You will hear a small click. Record Date, Time and  Symptom in the Patient Logbook.  When you are  ready to remove the patch, follow instructions on the last 2 pages of Patient  Logbook. Stick patch monitor onto the last page of Patient Logbook.  Place Patient Logbook in the blue and white box. Use locking tab on box and tape box closed  securely. The blue and white box has prepaid postage on it. Please place it in the mailbox as  soon as possible. Your physician should have your test results approximately 7 days after the  monitor has been mailed back to [facility].  Call [place] at [phone] if you have questions regarding  your ZIO XT patch monitor. Call them immediately if you see an orange light blinking on your  monitor.  If your monitor falls off in less than 4 days, contact our Monitor department at [phone].  If your monitor becomes loose or falls off after 4 days call [employer] at [phone] for  suggestions on securing your monitor   Important Information About Sugar

## 2022-08-21 NOTE — Progress Notes (Unsigned)
Enrolled for [employer] to mail a ZIO XT long term holter monitor to the patients address on file.  

## 2022-08-22 LAB — BASIC METABOLIC PANEL
BUN/Creatinine Ratio: 9 — ABNORMAL LOW (ref 12–28)
BUN: 9 mg/dL (ref 8–27)
CO2: 24 mmol/L (ref 20–29)
Calcium: 9.8 mg/dL (ref 8.7–10.3)
Chloride: 97 mmol/L (ref 96–106)
Creatinine, Ser: 0.96 mg/dL (ref 0.57–1.00)
Glucose: 98 mg/dL (ref 70–99)
Potassium: 4 mmol/L (ref 3.5–5.2)
Sodium: 137 mmol/L (ref 134–144)
eGFR: 61 mL/min/{1.73_m2} (ref >59)

## 2022-08-26 DIAGNOSIS — I4819 Other persistent atrial fibrillation: Secondary | ICD-10-CM

## 2022-08-26 DIAGNOSIS — I480 Paroxysmal atrial fibrillation: Secondary | ICD-10-CM

## 2022-09-06 ENCOUNTER — Ambulatory Visit: Payer: Medicare Other | Admitting: Cardiovascular Disease

## 2022-09-06 DIAGNOSIS — I480 Paroxysmal atrial fibrillation: Secondary | ICD-10-CM | POA: Diagnosis not present

## 2022-09-06 DIAGNOSIS — I4819 Other persistent atrial fibrillation: Secondary | ICD-10-CM | POA: Diagnosis not present

## 2022-09-17 ENCOUNTER — Ambulatory Visit: Payer: Medicare Other | Attending: Cardiovascular Disease | Admitting: Cardiovascular Disease

## 2022-09-17 ENCOUNTER — Encounter: Payer: Self-pay | Admitting: Cardiovascular Disease

## 2022-09-17 VITALS — BP 160/94 | HR 98 | Ht 66.5 in | Wt 193.0 lb

## 2022-09-17 DIAGNOSIS — I4819 Other persistent atrial fibrillation: Secondary | ICD-10-CM | POA: Insufficient documentation

## 2022-09-17 DIAGNOSIS — I10 Essential (primary) hypertension: Secondary | ICD-10-CM

## 2022-09-17 NOTE — Progress Notes (Signed)
Electrophysiology Office Note:    Date:  09/17/2022   ID:  Olivia Mendoza, DOB 05-Sep-1946, MRN [id number]  PCP:  Hoyt Koch, MD   [place] [facility] Cardiologist:  Larae Grooms, MD Electrophysiologist:  Melida Quitter, MD     Referring MD: Hoyt Koch, *   History of Present Illness:    Olivia Mendoza is a 77 y.o. female with a hx listed below, significant for persistent AF and PVCs, referred for arrhythmia management.  She was diagnosed with atrial fibrillation in August 2023 discovered incidentally during preoperative evaluation.  Metoprolol and Eliquis restarted.  She notices significant increase in fatigue and exertional intolerance that she attributes to starting metoprolol.  She underwent DC cardioversion on September 19 but had early recurrence of atrial fibrillation.   She has palpitations when she lies on her left side but otherwise is not aware that she is in atrial fibrillation.  She has been told that she has had PVCs for many years, and cannot differentiate the palpitations that she experiences now from PVCs that she has had in the past.  09/17/2022 She returns for follow-up after wearing a heart monitor.  At our previous visit, she complained of fatigue which she attributed to metoprolol.  We placed a heart rate monitor and discontinued her metoprolol.  She reports that she has felt great since stopping the metoprolol.  The heart monitor showed that her rates are controlled off metoprolol.  She does not have chest pain, fatigue, shortness of breath.  BP is significantly elevated today.  She notes that she dropped a clipboard, creating a loud noise that was very embarrassing for her just a few minutes before her blood pressure was checked.  Past Medical History:  Diagnosis Date   Dysphagia    Fibrocystic breast disease    GERD (gastroesophageal reflux disease)    History of cystopexy    History of migraines    Hyperlipemia    IBS  (irritable bowel syndrome)    Mononucleosis    MVP (mitral valve prolapse)    Positional vertigo    PVC (premature ventricular contraction)    Routine general medical examination at a health care facility     Past Surgical History:  Procedure Laterality Date   BIOPSY THYROID     BREAST CYST ASPIRATION     CARDIOVERSION N/A 05/29/2022   Procedure: CARDIOVERSION;  Surgeon: Elouise Munroe, MD;  Location: [facility];  Service: Cardiovascular;  Laterality: N/A;   LAPAROSCOPIC TUBAL LIGATION     Bilateral   VAGINAL HYSTERECTOMY  89    Current Medications: Current Meds  Medication Sig   amLODipine (NORVASC) 2.5 MG tablet Take 1 tablet (2.5 mg total) by mouth daily.   apixaban (ELIQUIS) 5 MG TABS tablet Take 1 tablet (5 mg total) by mouth 2 (two) times daily.   benzonatate (TESSALON) 200 MG capsule Take 200 mg by mouth 2 (two) times daily as needed for cough.   Cholecalciferol (VITAMIN D) 50 MCG (2000 UT) CAPS Take 2,000 Units by mouth daily.   Cyanocobalamin (B-12) 1000 MCG TABS Take 1,000 mcg by mouth daily.   estradiol (VIVELLE-DOT) 0.1 MG/24HR patch Place 0.5 patches onto the skin 2 (two) times a week.   famotidine (PEPCID) 20 MG tablet Take 20 mg by mouth daily.   Flaxseed, Linseed, (FLAX SEED OIL PO) Take 15 mLs by mouth daily.   fluticasone (FLONASE) 50 MCG/ACT nasal spray Place 2 sprays into both nostrils daily as needed for allergies  or rhinitis.   hydrochlorothiazide (HYDRODIURIL) 25 MG tablet Take 1 tablet (25 mg total) by mouth daily.   loratadine (CLARITIN) 10 MG tablet Take 10 mg by mouth daily.   meclizine (ANTIVERT) 12.5 MG tablet Take 1 tablet (12.5 mg total) by mouth 3 (three) times daily as needed for dizziness.   polyethylene glycol (MIRALAX / GLYCOLAX) 17 g packet Take 17 g by mouth See admin instructions. Mon, Tu Wed Th and Sat ONLY   scopolamine (TRANSDERM-SCOP) 1 MG/3DAYS Place 1 patch onto the skin every 3 (three) days as needed (Nausea).   simvastatin  (ZOCOR) 20 MG tablet TAKE 1 TABLET BY MOUTH EVERYDAY AT BEDTIME     Allergies:   Morphine and related, Penicillins, and Zetia [ezetimibe]   Social History   Socioeconomic History   Marital status: Divorced    Spouse name: Not on file   Number of children: Not on file   Years of education: Not on file   Highest education level: Not on file  Occupational History   Occupation: [employer]  Tobacco Use   Smoking status: Never   Smokeless tobacco: Never  Substance and Sexual Activity   Alcohol use: Never   Drug use: Never   Sexual activity: Not on file  Other Topics Concern   Not on file  Social History Narrative   [employer], [employer].  Married 7yr - divorced.  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 Work - continues to work for [employer]            Social Determinants of Health   Financial Resource Strain: Low Risk  (10/16/2021)   Overall Financial Resource Strain (CARDIA)    Difficulty of Paying Living Expenses: Not hard at all  Food Insecurity: No Food Insecurity (10/16/2021)   Hunger Vital Sign    Worried About Running Out of Food in the Last Year: Never true    Ran Out of Food in the Last Year: Never true  Transportation Needs: No Transportation Needs (10/16/2021)   PRAPARE - THydrologist(Medical): No    Lack of Transportation (Non-Medical): No  Physical Activity: Sufficiently Active (10/16/2021)   Exercise Vital Sign    Days of Exercise per Week: 5 days    Minutes of Exercise per Session: 30 min  Stress: No Stress Concern Present (10/16/2021)   [place]   Feeling of Stress : Not at all  Social Connections: Moderately Integrated (10/16/2021)   Social Connection and Isolation Panel [NHANES]    Frequency of Communication with Friends and Family: More than three times a week     Frequency of Social Gatherings with Friends and Family: More than three times a week    Attends Religious Services: More than 4 times per year    Active Member of [employer] Organizations: Yes    Attends CArchivistMeetings: More than 4 times per year    Marital Status: Widowed     Family History: The patient's family history includes Arthritis in her mother; Breast cancer (age of onset: 931 in her maternal grandmother; Heart disease in her brother, father, and mother; Hemochromatosis in her father; Hiatal hernia in her mother; Hyperlipidemia in her father and mother; Hypertension in her father and mother; Stroke in her mother.  ROS:   Please see the history of present illness.  All other systems reviewed and are negative.  EKGs/Labs/Other Studies Reviewed Today:    Monitor 09/17/22 Atrial Fibrillation occurred continuously (100% burden), ranging from 44-169 bpm (avg of 80 bpm). Isolated VEs were rare (<1.0%), and no VE Couplets or VE Triplets were present.   TTE 05/01/2022: LVEF 60-65%, severely dilated LA by metrics, Mild-mod MVR, moderate AR  EKG:  Last EKG results: AF with controlled rates  I reviewed all ECGs dating back to Feb, 2022, which was the most recent Ecg that showed sinus rhythm.   Recent Labs: 10/19/2021: ALT 20 05/01/2022: TSH 3.240 05/23/2022: Hemoglobin 15.7; Platelets 312 08/21/2022: BUN 9; Creatinine, Ser 0.96; Potassium 4.0; Sodium 137     Physical Exam:    VS:  BP (!) 160/94   Pulse 98   Ht 5' 6.5" (1.689 m)   Wt 193 lb (87.5 kg)   SpO2 97%   BMI 30.68 kg/m     Wt Readings from Last 3 Encounters:  09/17/22 193 lb (87.5 kg)  08/21/22 192 lb 9.6 oz (87.4 kg)  07/31/22 192 lb (87.1 kg)     GEN: Well nourished, well developed in no acute distress CARDIAC: Irregular rhythm, + murmur; no rubs, gallops RESPIRATORY:  Normal work of breathing MUSCULOSKELETAL: trace edema    ASSESSMENT & PLAN:    Atrial fibrillation: no plans for rhythm  control at this time. She reports that she is doing very well and her rates are perfectly acceptable. Secondary hypercoagulable state: due to AF with CHADS2Vasc of at least 4 Hypertension: following in hypertension clinic, on HCTZ, and amlodipine. I am not making any changes in her medications today.        Medication Adjustments/Labs and Tests Ordered: Current medicines are reviewed at length with the patient today.  Concerns regarding medicines are outlined above.  No orders of the defined types were placed in this encounter.  No orders of the defined types were placed in this encounter.    Signed, Melida Quitter, MD  09/17/2022 4:33 PM    [place]

## 2022-09-17 NOTE — Patient Instructions (Signed)
Medication Instructions:  Your physician recommends that you continue on your current medications as directed. Please refer to the Current Medication list given to you today.  *If you need a refill on your cardiac medications before your next appointment, please call your pharmacy*   Follow-Up: At [facility], you and your health needs are our priority.  As part of our continuing mission to provide you with exceptional heart care, we have created designated Provider Care Teams.  These Care Teams include your primary Cardiologist (physician) and Advanced Practice Providers (APPs -  Physician Assistants and Nurse Practitioners) who all work together to provide you with the care you need, when you need it.   Your next appointment:   Follow up with Dr. Irish Lack as scheduled  Important Information About Sugar

## 2022-09-20 ENCOUNTER — Ambulatory Visit: Payer: Medicare Other | Attending: Cardiology | Admitting: Pharmacist

## 2022-09-20 VITALS — BP 144/70

## 2022-09-20 DIAGNOSIS — I10 Essential (primary) hypertension: Secondary | ICD-10-CM | POA: Diagnosis not present

## 2022-09-20 MED ORDER — AMLODIPINE BESYLATE 5 MG PO TABS: 5.0000 mg | ORAL_TABLET | Freq: Every day | ORAL | 3 refills | Status: DC

## 2022-09-20 NOTE — Progress Notes (Signed)
Patient ID: Olivia Mendoza                 DOB: [birth year]                      MRN: [id number]      HPI: Olivia Mendoza is a 77 y.o. female patient of Dr. Irish Lack referred by Dr. Ambrose Pancoast, NP to HTN clinic. PMH is significant for HLD, aortic stenosis, paroxysmal AF, PVCs, MVP and HTN. Was seen by Jaquelyn Bitter on 07/31/22. BP was 158/76. HCTZ was increased from 12.'5mg'$  daily to '25mg'$  daily.  Seen in hypertension clinic on 08/15/2022, blood pressure at home was between 140s and 150s.  Amlodipine 2.5 mg daily was started.  Blood pressure few weeks later at appointment with Dr. Koren Bound was 130/84.  Metoprolol was DC'd to determine if her fatigue was from beta-blockers or A-fib.  Fatigue improved after stopping metoprolol and heart rates were controlled off of this medication as well her monitor.  Patient presents today for follow-up.  She states she has stopped drinking Pepsi.  She denies any dizziness lightheadedness or palpitations.  She has not yet increased her exercise but has cut back on caffeine.  She has set 3 goals for herself 1 is to quit Pepsi to is to read the entire Bible and 3 is to increase her exercise.  She brings in a list of home blood pressure readings average is 138/73 heart rate 83.   Current HTN meds: hydrochlorothiazide '25mg'$  daily, amlodipine 2.'5mg'$  daily Previously tried:  BP goal: <130/80  Family History:  Family History  Problem Relation Age of Onset   Hypertension Mother    Hyperlipidemia Mother    Stroke Mother    Hiatal hernia Mother    Arthritis Mother    Heart disease Mother        PTVDP-ss syndrome (Dr Elisabeth Cara)   Hemochromatosis Father    Hyperlipidemia Father    Hypertension Father    Heart disease Father        CAD/CABG   Heart disease Brother        AAA Stent, PVD s/p stent, CAD, lipids   Breast cancer Maternal Grandmother 92    Social History: no ETOH, no tobacco  Diet: none  Exercise:  Breakfast: bran cereal w/ milk, oranges Lunch: sandwich w/  rotisserie chicken, tomato soup, pimento cheese sandwich Dinner: salad once in awhile, spagetti with meat sauce (canned), frozen rice dishes Snacks: fruit Drinks: water, caffeine free pepsi 1 per day, zero sprit   Home BP readings:   systolic dyastolic HR   '136 65 84   140 74 94   127 68 88   145 70 94   134 79 86   130 81 82   150 77 91   144 78 71   136 '$ 70 73   139 71 78   143 76 85  Average 138.5455 73.54545 84.18182    Wt Readings from Last 3 Encounters:  09/17/22 193 lb (87.5 kg)  08/21/22 192 lb 9.6 oz (87.4 kg)  07/31/22 192 lb (87.1 kg)   BP Readings from Last 3 Encounters:  09/20/22 (!) 144/70  09/17/22 (!) 160/94  08/21/22 130/84   Pulse Readings from Last 3 Encounters:  09/17/22 98  08/21/22 85  08/15/22 84    Renal function: CrCl cannot be calculated (Patient's most recent lab result is older than the maximum 21 days allowed.).  Past Medical History:  Diagnosis Date   Dysphagia  Fibrocystic breast disease    GERD (gastroesophageal reflux disease)    History of cystopexy    History of migraines    Hyperlipemia    IBS (irritable bowel syndrome)    Mononucleosis    MVP (mitral valve prolapse)    Positional vertigo    PVC (premature ventricular contraction)    Routine general medical examination at a health care facility     Current Outpatient Medications on File Prior to Visit  Medication Sig Dispense Refill   apixaban (ELIQUIS) 5 MG TABS tablet Take 1 tablet (5 mg total) by mouth 2 (two) times daily. 60 tablet 6   benzonatate (TESSALON) 200 MG capsule Take 200 mg by mouth 2 (two) times daily as needed for cough.     Cholecalciferol (VITAMIN D) 50 MCG (2000 UT) CAPS Take 2,000 Units by mouth daily.     Cyanocobalamin (B-12) 1000 MCG TABS Take 1,000 mcg by mouth daily.     estradiol (VIVELLE-DOT) 0.1 MG/24HR patch Place 0.5 patches onto the skin 2 (two) times a week.     famotidine (PEPCID) 20 MG tablet Take 20 mg by mouth daily.     Flaxseed,  Linseed, (FLAX SEED OIL PO) Take 15 mLs by mouth daily.     fluticasone (FLONASE) 50 MCG/ACT nasal spray Place 2 sprays into both nostrils daily as needed for allergies or rhinitis.     hydrochlorothiazide (HYDRODIURIL) 25 MG tablet Take 1 tablet (25 mg total) by mouth daily. 90 tablet 1   loratadine (CLARITIN) 10 MG tablet Take 10 mg by mouth daily.     meclizine (ANTIVERT) 12.5 MG tablet Take 1 tablet (12.5 mg total) by mouth 3 (three) times daily as needed for dizziness. 30 tablet 3   polyethylene glycol (MIRALAX / GLYCOLAX) 17 g packet Take 17 g by mouth See admin instructions. Mon, Tu Wed Th and Sat ONLY     scopolamine (TRANSDERM-SCOP) 1 MG/3DAYS Place 1 patch onto the skin every 3 (three) days as needed (Nausea).     simethicone (MYLICON) 563 MG chewable tablet Chew 125 mg by mouth daily.     simvastatin (ZOCOR) 20 MG tablet TAKE 1 TABLET BY MOUTH EVERYDAY AT BEDTIME 90 tablet 3   No current facility-administered medications on file prior to visit.    Allergies  Allergen Reactions   Morphine And Related Hives and Nausea And Vomiting   Penicillins    Zetia [Ezetimibe]     Made IBS worse    Blood pressure (!) 144/70.   Assessment/Plan:  HYPERTENSION CONTROL Vitals:   09/20/22 1545 09/20/22 1546  BP: (!) 146/67 (!) 144/70    The patient's blood pressure is elevated above target today.  In order to address the patient's elevated BP:       1. Hypertension -   Hypertension Assessment:  Blood pressure above goal in clinic and her home averages also above goal Patient has done a great job cutting back on sugar and caffeine She has a goal to increase exercise, she enjoys dancing.  We discussed the [employer] prep class but she was not all that interested She states she feels great off of the metoprolol has a lot more energy to do things  Plan: Increase amlodipine to 5 mg daily.  Continue hydrochlorothiazide 25 mg daily Continue checking blood pressure at home.  Patient is to  bring in her home blood pressure cuff next visit as she forgot to bring it today She has an appointment with Dr. Irish Lack at the  end of February.  I will let that be follow-up to this visit. I have scheduled her an appointment with me for a few weeks after her appointment with MD in case blood pressure still not controlled Patient to increase exercise    Thank you  Ramond Dial, Pharm.D, BCPS, CPP [facility] [address] [place], [place] [address]  Phone: [phone]; Fax: [phone]

## 2022-09-20 NOTE — Assessment & Plan Note (Addendum)
Assessment:  Blood pressure above goal in clinic and her home averages also above goal Patient has done a great job cutting back on sugar and caffeine She has a goal to increase exercise, she enjoys dancing.  We discussed the [employer] prep class but she was not all that interested She states she feels great off of the metoprolol has a lot more energy to do things Congratulated her on her goals.  She also has a goal of losing 10 pounds per year.  Congratulated her on setting attainable goals  Plan: Increase amlodipine to 5 mg daily.  Continue hydrochlorothiazide 25 mg daily Continue checking blood pressure at home.  Patient is to bring in her home blood pressure cuff next visit as she forgot to bring it today She has an appointment with Dr. Irish Lack at the end of February.  I will let that be follow-up to this visit. I have scheduled her an appointment with me for a few weeks after her appointment with MD in case blood pressure still not controlled Patient to increase exercise

## 2022-09-20 NOTE — Patient Instructions (Signed)
Summary of today's discussion  1.Increase amlodipine to '5mg'$  daily  2. Continue hydrochlorothiazide '25mg'$  daily  3. Work on increasing activity  4.Please bring your blood pressure cuff to next visit  5.   Your blood pressure goal is <130/80  To check your pressure at home you will need to:  1. Sit up in a chair, with feet flat on the floor and back supported. Do not cross your ankles or legs. 2. Rest your left arm so that the cuff is about heart level. If the cuff goes on your upper arm,  then just relax the arm on the table, arm of the chair or your lap. If you have a wrist cuff, we  suggest relaxing your wrist against your chest (think of it as Pledging the Flag with the  wrong arm).  3. Place the cuff snugly around your arm, about 1 inch above the crook of your elbow. The  cords should be inside the groove of your elbow.  4. Sit quietly, with the cuff in place, for about 5 minutes. After that 5 minutes press the power  button to start a reading. 5. Do not talk or move while the reading is taking place.  6. Record your readings on a sheet of paper. Although most cuffs have a memory, it is often  easier to see a pattern developing when the numbers are all in front of you.  7. You can repeat the reading after 1-3 minutes if it is recommended  Make sure your bladder is empty and you have not had caffeine or tobacco within the last 30 min  Always bring your blood pressure log with you to your appointments. If you have not brought your monitor in to be double checked for accuracy, please bring it to your next appointment.  You can find a list of validated (accurate) blood pressure cuffs at [email or web address]   Important lifestyle changes to control high blood pressure  Intervention  Effect on the BP  Lose extra pounds and watch your waistline Weight loss is one of the most effective lifestyle changes for controlling blood pressure. If you're overweight or obese, losing even a small amount  of weight can help reduce blood pressure. Blood pressure might go down by about 1 millimeter of mercury (mm Hg) with each kilogram (about 2.2 pounds) of weight lost.  Exercise regularly As a general goal, aim for at least 30 minutes of moderate physical activity every day. Regular physical activity can lower high blood pressure by about 5 to 8 mm Hg.  Eat a healthy diet Eating a diet rich in whole grains, fruits, vegetables, and low-fat dairy products and low in saturated fat and cholesterol. A healthy diet can lower high blood pressure by up to 11 mm Hg.  Reduce salt (sodium) in your diet Even a small reduction of sodium in the diet can improve heart health and reduce high blood pressure by about 5 to 6 mm Hg.  Limit alcohol One drink equals 12 ounces of beer, 5 ounces of wine, or 1.5 ounces of 80-proof liquor.  Limiting alcohol to less than one drink a day for women or two drinks a day for men can help lower blood pressure by about 4 mm Hg.   Please call me at [phone] with any questions.

## 2022-10-18 ENCOUNTER — Encounter (HOSPITAL_COMMUNITY): Payer: Self-pay | Admitting: *Deleted

## 2022-10-22 ENCOUNTER — Encounter: Payer: Medicare Other | Admitting: Internal Medicine

## 2022-10-30 ENCOUNTER — Telehealth: Payer: Self-pay

## 2022-10-30 NOTE — Telephone Encounter (Signed)
**Note De-Identified Talmadge Ganas Obfuscation** Letter received from [facility] stating that they have denied the pt Eliquis assistance. Reasons for denial: Household income exceeds the program limits and  Documentation of 3% out of pocket expenses, based on the household adjusted gross income, not met.  The letter states that they have notified the pt of this denial as well.

## 2022-11-02 ENCOUNTER — Ambulatory Visit (INDEPENDENT_AMBULATORY_CARE_PROVIDER_SITE_OTHER): Payer: Medicare Other | Admitting: Internal Medicine

## 2022-11-02 ENCOUNTER — Encounter: Payer: Self-pay | Admitting: Internal Medicine

## 2022-11-02 VITALS — BP 120/100 | HR 92 | Temp 97.8°F | Ht 66.5 in | Wt 191.0 lb

## 2022-11-02 DIAGNOSIS — M1990 Unspecified osteoarthritis, unspecified site: Secondary | ICD-10-CM | POA: Insufficient documentation

## 2022-11-02 DIAGNOSIS — H8110 Benign paroxysmal vertigo, unspecified ear: Secondary | ICD-10-CM | POA: Diagnosis not present

## 2022-11-02 DIAGNOSIS — E785 Hyperlipidemia, unspecified: Secondary | ICD-10-CM | POA: Diagnosis not present

## 2022-11-02 DIAGNOSIS — Z0000 Encounter for general adult medical examination without abnormal findings: Secondary | ICD-10-CM | POA: Diagnosis not present

## 2022-11-02 DIAGNOSIS — J3089 Other allergic rhinitis: Secondary | ICD-10-CM

## 2022-11-02 DIAGNOSIS — I10 Essential (primary) hypertension: Secondary | ICD-10-CM

## 2022-11-02 DIAGNOSIS — I4819 Other persistent atrial fibrillation: Secondary | ICD-10-CM | POA: Diagnosis not present

## 2022-11-02 DIAGNOSIS — K219 Gastro-esophageal reflux disease without esophagitis: Secondary | ICD-10-CM | POA: Diagnosis not present

## 2022-11-02 DIAGNOSIS — R7301 Impaired fasting glucose: Secondary | ICD-10-CM

## 2022-11-02 DIAGNOSIS — R000 Tachycardia, unspecified: Secondary | ICD-10-CM | POA: Insufficient documentation

## 2022-11-02 DIAGNOSIS — M8580 Other specified disorders of bone density and structure, unspecified site: Secondary | ICD-10-CM | POA: Insufficient documentation

## 2022-11-02 LAB — HEMOGLOBIN A1C: Hgb A1c MFr Bld: 5.8 % (ref 4.6–6.5)

## 2022-11-02 LAB — LIPID PANEL
Cholesterol: 248 mg/dL — ABNORMAL HIGH (ref 0–200)
HDL: 57.1 mg/dL (ref >39.00)
NonHDL: 190.93
Total CHOL/HDL Ratio: 4
Triglycerides: 371 mg/dL — ABNORMAL HIGH (ref 0.0–149.0)
VLDL: 74.2 mg/dL — ABNORMAL HIGH (ref 0.0–40.0)

## 2022-11-02 LAB — LDL CHOLESTEROL, DIRECT: Direct LDL: 134 mg/dL

## 2022-11-02 MED ORDER — BENZONATATE 200 MG PO CAPS: 200.0000 mg | ORAL_CAPSULE | Freq: Two times a day (BID) | ORAL | 0 refills | Status: DC | PRN

## 2022-11-02 MED ORDER — MECLIZINE HCL 12.5 MG PO TABS: 12.5000 mg | ORAL_TABLET | Freq: Three times a day (TID) | ORAL | 3 refills | Status: DC | PRN

## 2022-11-02 MED ORDER — FLUTICASONE PROPIONATE 50 MCG/ACT NA SUSP: 2.0000 | Freq: Every day | NASAL | 3 refills | Status: DC | PRN

## 2022-11-02 NOTE — Patient Instructions (Signed)
We do not need labs today.    

## 2022-11-02 NOTE — Progress Notes (Signed)
   Subjective:   Patient ID: Olivia Mendoza, female    DOB: [birth year], 77 y.o.   MRN: [id number]  HPI The patient is a 77 YO female coming in for follow up. BP slightly elevated lately and new A fib since last year.   Review of Systems  Constitutional: Negative.   HENT: Negative.    Eyes: Negative.   Respiratory:  Positive for shortness of breath. Negative for cough and chest tightness.   Cardiovascular:  Negative for chest pain, palpitations and leg swelling.  Gastrointestinal:  Negative for abdominal distention, abdominal pain, constipation, diarrhea, nausea and vomiting.  Musculoskeletal: Negative.   Skin: Negative.   Neurological: Negative.   Psychiatric/Behavioral: Negative.      Objective:  Physical Exam Constitutional:      Appearance: She is well-developed.  HENT:     Head: Normocephalic and atraumatic.  Cardiovascular:     Rate and Rhythm: Normal rate. Rhythm irregular.  Pulmonary:     Effort: Pulmonary effort is normal. No respiratory distress.     Breath sounds: Normal breath sounds. No wheezing or rales.  Abdominal:     General: Bowel sounds are normal. There is no distension.     Palpations: Abdomen is soft.     Tenderness: There is no abdominal tenderness. There is no rebound.  Musculoskeletal:     Cervical back: Normal range of motion.  Skin:    General: Skin is warm and dry.  Neurological:     Mental Status: She is alert and oriented to person, place, and time.     Coordination: Coordination normal.     Vitals:   11/02/22 1507 11/02/22 1613  BP: (!) 162/80 (!) 120/100  Pulse: 92   Temp: 97.8 F (36.6 C)   TempSrc: Temporal   SpO2: 99%   Weight: 191 lb (86.6 kg)   Height: 5' 6.5" (1.689 m)     Assessment & Plan:

## 2022-11-02 NOTE — Assessment & Plan Note (Signed)
Refilled meclizine which she uses prn only. No flare today.

## 2022-11-02 NOTE — Assessment & Plan Note (Signed)
New since last year. She is currently taking eliquis for anticoagulation and this may be unaffordable soon. She is seeing pharmacy through cardiology and will talk to them about this. Unable to tolerate beta blockers on amlodipine and rate controlled.

## 2022-11-02 NOTE — Assessment & Plan Note (Signed)
BP elevated today and she is taking hctz 25 mg daily and amlodipine 5 mg daily. She is seeing cardiology next week and wishes to wait for adjustment until then but likely would benefit from amlodipine 10 mg daily uptitration.

## 2022-11-02 NOTE — Assessment & Plan Note (Signed)
Taking pepcid 20 mg daily and controlled.

## 2022-11-02 NOTE — Assessment & Plan Note (Signed)
Refilled flonase and tessalon perles which she uses as needed.

## 2022-11-02 NOTE — Assessment & Plan Note (Signed)
Checking lipid panel and adjust as needed simvastatin 20 mg daily.

## 2022-11-04 NOTE — Progress Notes (Unsigned)
Cardiology Office Note   Date:  11/05/2022   ID:  Olivia Mendoza, DOB [birth year], MRN [id number]  PCP:  Hoyt Koch, MD    No chief complaint on file.  AFib  Wt Readings from Last 3 Encounters:  11/05/22 190 lb (86.2 kg)  11/02/22 191 lb (86.6 kg)  09/17/22 193 lb (87.5 kg)       History of Present Illness: Olivia Mendoza is a 77 y.o. female who was diagnosed with atrial fibrillation and August 2023 in anticipation of lumpectomy for breast papilloma.  She went for preop ECG before her planned breast surgery. ECG showed she was in atrial fibrillation with borderline rate control. She was asymptomatic. She has had symptomatic PVCs in the past.   Brother is a smoker and had AAA and multiple coronary stents.     In August 2022, echocardiogram was done.  Plan was: "Normal LV/RV function.  Mild to moderate mitral regurgitation.  Moderate aortic regurgitation.  She will annual follow-up in the office for valvular disease.  Okay to proceed with surgery at this point.  She will need to hold Eliquis 2 days prior.  After surgery, we can restart her Eliquis and then potentially look into elective cardioversion 3 weeks after the start of Eliquis. "  Never had her breast surgery.  Sinus rhythm lasted for 2 days after the cardioversion in 9/23 but then atrial fibrillation returned.Rate controlled with metoprolol but then side effects from metoprolol were too severe so this was stopped.  Fatigue and depression.   Eliquis program is removing her so ELiquis will be too expensive.     Past Medical History:  Diagnosis Date   Dysphagia    Fibrocystic breast disease    GERD (gastroesophageal reflux disease)    History of cystopexy    History of migraines    Hyperlipemia    IBS (irritable bowel syndrome)    Mononucleosis    MVP (mitral valve prolapse)    Positional vertigo    PVC (premature ventricular contraction)    Routine general medical examination at a health care facility      Past Surgical History:  Procedure Laterality Date   BIOPSY THYROID     BREAST CYST ASPIRATION     CARDIOVERSION N/A 05/29/2022   Procedure: CARDIOVERSION;  Surgeon: Elouise Munroe, MD;  Location: [facility];  Service: Cardiovascular;  Laterality: N/A;   LAPAROSCOPIC TUBAL LIGATION     Bilateral   VAGINAL HYSTERECTOMY  92     Current Outpatient Medications  Medication Sig Dispense Refill   amLODipine (NORVASC) 5 MG tablet Take 1 tablet (5 mg total) by mouth daily. 90 tablet 3   apixaban (ELIQUIS) 5 MG TABS tablet Take 1 tablet (5 mg total) by mouth 2 (two) times daily. 60 tablet 6   benzonatate (TESSALON) 200 MG capsule Take 1 capsule (200 mg total) by mouth 2 (two) times daily as needed for cough. 60 capsule 0   Cholecalciferol (VITAMIN D) 50 MCG (2000 UT) CAPS Take 2,000 Units by mouth daily.     Cyanocobalamin (B-12) 1000 MCG TABS Take 1,000 mcg by mouth daily.     estradiol (VIVELLE-DOT) 0.1 MG/24HR patch Place 0.5 patches onto the skin 2 (two) times a week.     famotidine (PEPCID) 20 MG tablet Take 20 mg by mouth daily.     Flaxseed, Linseed, (FLAX SEED OIL PO) Take 15 mLs by mouth daily.     fluticasone (FLONASE) 50 MCG/ACT nasal spray Place 2  sprays into both nostrils daily as needed for allergies or rhinitis. 48 g 3   hydrochlorothiazide (HYDRODIURIL) 25 MG tablet Take 1 tablet (25 mg total) by mouth daily. 90 tablet 1   loratadine (CLARITIN) 10 MG tablet Take 10 mg by mouth daily.     meclizine (ANTIVERT) 12.5 MG tablet Take 1 tablet (12.5 mg total) by mouth 3 (three) times daily as needed for dizziness. 30 tablet 3   polyethylene glycol (MIRALAX / GLYCOLAX) 17 g packet Take 17 g by mouth See admin instructions. Mon, Tu Wed Th and Sat ONLY     simethicone (MYLICON) 0000000 MG chewable tablet Chew 125 mg by mouth daily. Patient has tablet form. Swallow whole     simvastatin (ZOCOR) 20 MG tablet TAKE 1 TABLET BY MOUTH EVERYDAY AT BEDTIME 90 tablet 3   No current  facility-administered medications for this visit.    Allergies:   Morphine and related, Penicillins, and Zetia [ezetimibe]    Social History:  The patient  reports that she has never smoked. She has never used smokeless tobacco. She reports that she does not drink alcohol and does not use drugs.   Family History:  The patient's family history includes Arthritis in her mother; Breast cancer (age of onset: 93) in her maternal grandmother; Heart disease in her brother, father, and mother; Hemochromatosis in her father; Hiatal hernia in her mother; Hyperlipidemia in her father and mother; Hypertension in her father and mother; Stroke in her mother.    ROS:  Please see the history of present illness.   Otherwise, review of systems are positive for feels better off of metoprolol.   All other systems are reviewed and negative.    PHYSICAL EXAM: VS:  BP (!) 162/82   Pulse 96   Ht 5' 6.5" (1.689 m)   Wt 190 lb (86.2 kg)   SpO2 98%   BMI 30.21 kg/m  , BMI Body mass index is 30.21 kg/m. GEN: Well nourished, well developed, in no acute distress HEENT: normal Neck: no JVD, carotid bruits, or masses Cardiac: irregularly irregular, rate controlled; 2/6 diastolic murmur,no rubs, or gallops,no edema  Respiratory:  clear to auscultation bilaterally, normal work of breathing GI: soft, nontender, nondistended, + BS MS: no deformity or atrophy Skin: warm and dry, no rash Neuro:  Strength and sensation are intact Psych: euthymic mood, full affect   EKG:   The ekg ordered 12/23 demonstrates AFib   Recent Labs: 05/01/2022: TSH 3.240 05/23/2022: Hemoglobin 15.7; Platelets 312 08/21/2022: BUN 9; Creatinine, Ser 0.96; Potassium 4.0; Sodium 137   Lipid Panel    Component Value Date/Time   CHOL 248 (H) 11/02/2022 1614   TRIG 371.0 (H) 11/02/2022 1614   HDL 57.10 11/02/2022 1614   CHOLHDL 4 11/02/2022 1614   VLDL 74.2 (H) 11/02/2022 1614   LDLCALC 125 (H) 08/25/2015 1033   LDLDIRECT 134.0  11/02/2022 1614     Other studies Reviewed: Additional studies/ records that were reviewed today with results demonstrating: Hbg 15.7 in 9/23.   ASSESSMENT AND PLAN:  Atrial fibrillation: Feels better off of metoprolol.  Rate controlled.  Eliquis for stroke prevention for now.  Try to find another anticoagulant that is more cost effective.   Willing to consider warfarin.   Mitral regurgitation:moderate.  Aortic regurgitation: moderate on echo. Anticoagulated: Eliquis for now.   Discussed with Melissa.  Xarelto would not be any cheaper.  Plan to switch her to warfarin in March 2024. Hypertension: Blood pressure was elevated at her  visit with her primary care doctor several days ago.  She wanted to wait till this visit to titrate medications.  She feels that it is high in the MDs office.  Home readings are in the 130s/70. If home readings are higher, would increase amlodipine to 10 mg daily.   Obesity: Still not exercising.  Stamina has decreased.  If the stamina is decreased because of the atrial fibrillation, then that would be a reason to try to maintain the normal rhythm, whether it is with ablation or rhythm control medicines.  Both would be options but it would be worthwhile to consider trying to get the normal rhythm back from symptomatic AFib.   Current medicines are reviewed at length with the patient today.  The patient concerns regarding her medicines were addressed.  The following changes have been made:  No change  Labs/ tests ordered today include:  No orders of the defined types were placed in this encounter.   Recommend 150 minutes/week of aerobic exercise Low fat, low carb, high fiber diet recommended  Disposition:   FU in 6 months with me   Signed, Larae Grooms, MD  11/05/2022 3:42 PM    [facility] [address], [place], [place]  [address] Phone: [phone]; Fax: [phone]

## 2022-11-05 ENCOUNTER — Telehealth: Payer: Self-pay | Admitting: Pharmacist

## 2022-11-05 ENCOUNTER — Ambulatory Visit: Payer: Medicare Other | Attending: Interventional Cardiology | Admitting: Interventional Cardiology

## 2022-11-05 ENCOUNTER — Encounter: Payer: Self-pay | Admitting: Interventional Cardiology

## 2022-11-05 VITALS — BP 162/82 | HR 96 | Ht 66.5 in | Wt 190.0 lb

## 2022-11-05 DIAGNOSIS — I340 Nonrheumatic mitral (valve) insufficiency: Secondary | ICD-10-CM | POA: Insufficient documentation

## 2022-11-05 DIAGNOSIS — I4819 Other persistent atrial fibrillation: Secondary | ICD-10-CM | POA: Diagnosis not present

## 2022-11-05 DIAGNOSIS — I341 Nonrheumatic mitral (valve) prolapse: Secondary | ICD-10-CM

## 2022-11-05 DIAGNOSIS — E785 Hyperlipidemia, unspecified: Secondary | ICD-10-CM | POA: Diagnosis not present

## 2022-11-05 DIAGNOSIS — I10 Essential (primary) hypertension: Secondary | ICD-10-CM | POA: Insufficient documentation

## 2022-11-05 DIAGNOSIS — D6869 Other thrombophilia: Secondary | ICD-10-CM | POA: Diagnosis not present

## 2022-11-05 NOTE — Telephone Encounter (Signed)
Dr. Irish Lack reached out to me about pt being denied for Eliquis pt assistance. Ok to switch to warfarin per Dr. Beau Fanny. Pt has 1 month of Eliquis left. She has HTN apt with me 3/12. Called pt to let her know that we will discuss changing to warfarin at that apt.  LVM

## 2022-11-05 NOTE — Patient Instructions (Signed)
Medication Instructions:  Your physician recommends that you continue on your current medications as directed. Please refer to the Current Medication list given to you today.  *If you need a refill on your cardiac medications before your next appointment, please call your pharmacy*   Lab Work: none If you have labs (blood work) drawn today and your tests are completely normal, you will receive your results only by: [place] (if you have MyChart) OR A paper copy in the mail If you have any lab test that is abnormal or we need to change your treatment, we will call you to review the results.   Testing/Procedures: none   Follow-Up: At [facility], you and your health needs are our priority.  As part of our continuing mission to provide you with exceptional heart care, we have created designated Provider Care Teams.  These Care Teams include your primary Cardiologist (physician) and Advanced Practice Providers (APPs -  Physician Assistants and Nurse Practitioners) who all work together to provide you with the care you need, when you need it.  We recommend signing up for the patient portal called "MyChart".  Sign up information is provided on this After Visit Summary.  MyChart is used to connect with patients for Virtual Visits (Telemedicine).  Patients are able to view lab/test results, encounter notes, upcoming appointments, etc.  Non-urgent messages can be sent to your provider as well.   To learn more about what you can do with MyChart, go to [email or web address].    Your next appointment:   06/10/23 at 9:20  Provider:   Larae Grooms, MD     Other Instructions Follow up with pharmacist as scheduled on November 20, 2022

## 2022-11-07 ENCOUNTER — Ambulatory Visit (INDEPENDENT_AMBULATORY_CARE_PROVIDER_SITE_OTHER): Payer: Medicare Other

## 2022-11-07 VITALS — Ht 66.5 in | Wt 191.0 lb

## 2022-11-07 DIAGNOSIS — Z0000 Encounter for general adult medical examination without abnormal findings: Secondary | ICD-10-CM

## 2022-11-07 NOTE — Progress Notes (Signed)
I connected with  [name] on 11/07/22 by a audio enabled telemedicine application and verified that I am speaking with the correct person using two identifiers.  Patient Location: Home  Provider Location: Office/Clinic  I discussed the limitations of evaluation and management by telemedicine. The patient expressed understanding and agreed to proceed.  Subjective:   Olivia Mendoza is a 77 y.o. female who presents for Medicare Annual (Subsequent) preventive examination.  Review of Systems     Cardiac Risk Factors include: advanced age (>9mn, >>60women);dyslipidemia;hypertension;obesity (BMI >30kg/m2)     Objective:    Today's Vitals   11/07/22 1457  Weight: 191 lb (86.6 kg)  Height: 5' 6.5" (1.689 m)   Body mass index is 30.37 kg/m.     11/07/2022    3:04 PM 05/29/2022    9:51 AM 10/16/2021    3:25 PM 10/12/2020    9:39 AM  Advanced Directives  Does Patient Have a Medical Advance Directive? Yes Yes Yes Yes  Type of AParamedicof AVanduserLiving will [place]Living will Living will;Healthcare Power of Attorney Living will;Healthcare Power of Attorney  Does patient want to make changes to medical advance directive?   No - Patient declined No - Patient declined  Copy of HKendall Westin Chart? No - copy requested No - copy requested No - copy requested No - copy requested    Current Medications (verified) Outpatient Encounter Medications as of 11/07/2022  Medication Sig   amLODipine (NORVASC) 5 MG tablet Take 1 tablet (5 mg total) by mouth daily.   apixaban (ELIQUIS) 5 MG TABS tablet Take 1 tablet (5 mg total) by mouth 2 (two) times daily.   benzonatate (TESSALON) 200 MG capsule Take 1 capsule (200 mg total) by mouth 2 (two) times daily as needed for cough.   Cholecalciferol (VITAMIN D) 50 MCG (2000 UT) CAPS Take 2,000 Units by mouth daily.   Cyanocobalamin (B-12) 1000 MCG TABS Take 1,000 mcg by mouth daily.    estradiol (VIVELLE-DOT) 0.1 MG/24HR patch Place 0.5 patches onto the skin 2 (two) times a week.   famotidine (PEPCID) 20 MG tablet Take 20 mg by mouth daily.   Flaxseed, Linseed, (FLAX SEED OIL PO) Take 15 mLs by mouth daily.   fluticasone (FLONASE) 50 MCG/ACT nasal spray Place 2 sprays into both nostrils daily as needed for allergies or rhinitis.   hydrochlorothiazide (HYDRODIURIL) 25 MG tablet Take 1 tablet (25 mg total) by mouth daily.   loratadine (CLARITIN) 10 MG tablet Take 10 mg by mouth daily.   meclizine (ANTIVERT) 12.5 MG tablet Take 1 tablet (12.5 mg total) by mouth 3 (three) times daily as needed for dizziness.   polyethylene glycol (MIRALAX / GLYCOLAX) 17 g packet Take 17 g by mouth See admin instructions. Mon, Tu Wed Th and Sat ONLY   simethicone (MYLICON) 10000000MG chewable tablet Chew 125 mg by mouth daily. Patient has tablet form. Swallow whole   simvastatin (ZOCOR) 20 MG tablet TAKE 1 TABLET BY MOUTH EVERYDAY AT BEDTIME   No facility-administered encounter medications on file as of 11/07/2022.    Allergies (verified) Morphine and related, Penicillins, and Zetia [ezetimibe]   History: Past Medical History:  Diagnosis Date   Dysphagia    Fibrocystic breast disease    GERD (gastroesophageal reflux disease)    History of cystopexy    History of migraines    Hyperlipemia    IBS (irritable bowel syndrome)    Mononucleosis  MVP (mitral valve prolapse)    Positional vertigo    PVC (premature ventricular contraction)    Routine general medical examination at a health care facility    Past Surgical History:  Procedure Laterality Date   BIOPSY THYROID     BREAST CYST ASPIRATION     CARDIOVERSION N/A 05/29/2022   Procedure: CARDIOVERSION;  Surgeon: Elouise Munroe, MD;  Location: [facility] ENDOSCOPY;  Service: Cardiovascular;  Laterality: N/A;   LAPAROSCOPIC TUBAL LIGATION     Bilateral   VAGINAL HYSTERECTOMY  37   Family History  Problem Relation Age of Onset    Hypertension Mother    Hyperlipidemia Mother    Stroke Mother    Hiatal hernia Mother    Arthritis Mother    Heart disease Mother        PTVDP-ss syndrome (Dr Elisabeth Cara)   Hemochromatosis Father    Hyperlipidemia Father    Hypertension Father    Heart disease Father        CAD/CABG   Heart disease Brother        AAA Stent, PVD s/p stent, CAD, lipids   Breast cancer Maternal Grandmother 92   Social History   Socioeconomic History   Marital status: Divorced    Spouse name: Not on file   Number of children: Not on file   Years of education: Not on file   Highest education level: Not on file  Occupational History   Occupation: Music therapist  Tobacco Use   Smoking status: Never   Smokeless tobacco: Never  Vaping Use   Vaping Use: Never used  Substance and Sexual Activity   Alcohol use: Never   Drug use: Never   Sexual activity: Not on file  Other Topics Concern   Not on file  Social History Narrative   [employer], [employer].  Married 41yr - divorced.  2 daughters, 1 son died 15 daysafter birth, 5 grandchildren   Live alone - independently, caregiver with mother. Work - continues to work for [employer]            Social Determinants of Health   Financial Resource Strain: Low Risk  (11/07/2022)   Overall Financial Resource Strain (CARDIA)    Difficulty of Paying Living Expenses: Not hard at all  Food Insecurity: No Food Insecurity (11/07/2022)   Hunger Vital Sign    Worried About Running Out of Food in the Last Year: Never true    Ran Out of Food in the Last Year: Never true  Transportation Needs: No Transportation Needs (11/07/2022)   PRAPARE - THydrologist(Medical): No    Lack of Transportation (Non-Medical): No  Physical Activity: Inactive (11/07/2022)   Exercise Vital Sign    Days of Exercise per Week: 0 days    Minutes of Exercise per Session: 0 min  Stress: No Stress Concern Present (11/07/2022)   FMcFall   Feeling of Stress : Not at all  Social Connections: Moderately Integrated (10/16/2021)   Social Connection and Isolation Panel [NHANES]    Frequency of Communication with Friends and Family: More than three times a week    Frequency of Social Gatherings with Friends and Family: More than three times a week    Attends Religious Services: More than 4 times per year    Active Member of [employer] Organizations: Yes    Attends CArchivistMeetings: More than 4 times  per year    Marital Status: Widowed    Tobacco Counseling Counseling given: Not Answered   Clinical Intake:  Pre-visit preparation completed: Yes  Pain : No/denies pain     Nutritional Status: BMI > 30  Obese Nutritional Risks: None Diabetes: No  How often do you need to have someone help you when you read instructions, pamphlets, or other written materials from your doctor or pharmacy?: 1 - Never  Diabetic? no  Interpreter Needed?: No  Information entered by :: NAllen LPN   Activities of Daily Living    11/07/2022    3:06 PM  In your present state of health, do you have any difficulty performing the following activities:  Hearing? 0  Vision? 0  Difficulty concentrating or making decisions? 0  Walking or climbing stairs? 1  Comment sob  Dressing or bathing? 0  Doing errands, shopping? 0  Preparing Food and eating ? N  Using the Toilet? N  In the past six months, have you accidently leaked urine? Y  Comment with sneeze or cough  Do you have problems with loss of bowel control? N  Managing your Medications? N  Managing your Finances? N  Housekeeping or managing your Housekeeping? N    Patient Care Team: Hoyt Koch, MD as PCP - General (Internal Medicine) [name], MD as PCP - Cardiology (Cardiology) Mealor, Yetta Barre, MD as PCP - Electrophysiology (Cardiology) Selinda Orion, MD (Inactive)  (Obstetrics and Gynecology) Inda Castle, MD (Inactive) (Gastroenterology) Katy Apo, MD as Consulting Physician (Ophthalmology)  Indicate any recent Medical Services you may have received from other than [facility] providers in the past year (date may be approximate).     Assessment:   This is a routine wellness examination for Elliyana.  Hearing/Vision screen Vision Screening - Comments:: Regular eye exams, Dr. Prudencio Burly  Dietary issues and exercise activities discussed: Current Exercise Habits: The patient does not participate in regular exercise at present   Goals Addressed             This Visit's Progress    Patient Stated       11/07/2022, start walking and lose 10 pounds and maintain it       Depression Screen    11/07/2022    3:05 PM 11/02/2022    3:18 PM 10/16/2021    3:13 PM 10/12/2020    9:36 AM 09/30/2019   10:09 AM 09/23/2018    2:51 PM 09/19/2017    1:43 PM  PHQ 2/9 Scores  PHQ - 2 Score 0 0 0 0 0 1 0    Fall Risk    11/07/2022    3:05 PM 11/02/2022    3:18 PM 10/16/2021    3:14 PM 10/12/2020    9:39 AM 09/30/2019   10:08 AM  [place] in the past year? 0 0 0 0 0  Number falls in past yr: 0 0 0 0   Injury with Fall? 0 0 0 0   Risk for fall due to : Medication side effect No Fall Risks No Fall Risks No Fall Risks   Follow up Falls prevention discussed;Education provided;Falls evaluation completed Falls evaluation completed Falls evaluation completed      FALL RISK PREVENTION PERTAINING TO THE HOME:  Any stairs in or around the home? No  If so, are there any without handrails? N/a Home free of loose throw rugs in walkways, pet beds, electrical cords, etc? Yes  Adequate lighting in your  home to reduce risk of falls? Yes   ASSISTIVE DEVICES UTILIZED TO PREVENT FALLS:  Life alert? No  Use of a cane, walker or w/c? No  Grab bars in the bathroom? Yes  Shower chair or bench in shower? Yes  Elevated toilet seat or a handicapped toilet? Yes   TIMED UP  AND GO:  Was the test performed? No .      Cognitive Function:        11/07/2022    3:09 PM  6CIT Screen  What Year? 0 points  What month? 0 points  What time? 0 points  Count back from 20 0 points  Months in reverse 0 points  Repeat phrase 0 points  Total Score 0 points    Immunizations Immunization History  Administered Date(s) Administered   Fluad Quad(high Dose 65+) 06/20/2020, 06/28/2022   Influenza Whole 08/10/2009   Influenza, High Dose Seasonal PF 08/08/2013, 06/23/2016, 06/16/2019, 07/12/2021   Influenza,inj,Quad PF,6+ Mos 06/17/2014, 06/11/2018   Influenza-Unspecified 07/04/2015, 06/28/2017, 06/16/2019   PFIZER(Purple Top)SARS-COV-2 Vaccination 11/09/2019, 12/08/2019, 06/25/2020   Pneumococcal Conjugate-13 08/22/2015   Pneumococcal Polysaccharide-23 12/20/2010   Tdap 12/20/2010   Zoster Recombinat (Shingrix) 12/23/2017, 05/22/2018   Zoster, Live 12/16/2007    TDAP status: Due, Education has been provided regarding the importance of this vaccine. Advised may receive this vaccine at local pharmacy or Health Dept. Aware to provide a copy of the vaccination record if obtained from local pharmacy or Health Dept. Verbalized acceptance and understanding.  Flu Vaccine status: Up to date  Pneumococcal vaccine status: Up to date  Covid-19 vaccine status: Completed vaccines  Qualifies for Shingles Vaccine? Yes   Zostavax completed Yes   Shingrix Completed?: Yes  Screening Tests Health Maintenance  Topic Date Due   DTaP/Tdap/Td (2 - Td or Tdap) 12/19/2020   Medicare Annual Wellness (AWV)  10/16/2022   COVID-19 Vaccine (4 - 2023-24 season) 11/18/2022 (Originally 05/11/2022)   Pneumonia Vaccine 65+ Years old  Completed   INFLUENZA VACCINE  Completed   DEXA SCAN  Completed   Hepatitis C Screening  Completed   Zoster Vaccines- Shingrix  Completed   HPV VACCINES  Aged Out   COLONOSCOPY (Pts 45-74yr Insurance coverage will need to be confirmed)  D[place]Maintenance Due  Topic Date Due   DTaP/Tdap/Td (2 - Td or Tdap) 12/19/2020   Medicare Annual Wellness (AWV)  10/16/2022    Colorectal cancer screening: No longer required.   Mammogram status: Completed 02/13/2022. Repeat every year  Bone Density status: Completed 09/11/2011.   Lung Cancer Screening: (Low Dose CT Chest recommended if Age 78-80years, 30 pack-year currently smoking OR have quit w/in 15years.) does not qualify.   Lung Cancer Screening Referral: no  Additional Screening:  Hepatitis C Screening: does qualify; Completed 08/25/2015  Vision Screening: Recommended annual ophthalmology exams for early detection of glaucoma and other disorders of the eye. Is the patient up to date with their annual eye exam?  Yes  Who is the provider or what is the name of the office in which the patient attends annual eye exams? Dr. LPrudencio BurlyIf pt is not established with a provider, would they like to be referred to a provider to establish care? No .   Dental Screening: Recommended annual dental exams for proper oral hygiene  Community Resource Referral / Chronic Care Management: CRR required this visit?  No   CCM required this visit?  No      Plan:  I have personally reviewed and noted the following in the patient's chart:   Medical and social history Use of alcohol, tobacco or illicit drugs  Current medications and supplements including opioid prescriptions. Patient is not currently taking opioid prescriptions. Functional ability and status Nutritional status Physical activity Advanced directives List of other physicians Hospitalizations, surgeries, and ER visits in previous 12 months Vitals Screenings to include cognitive, depression, and falls Referrals and appointments  In addition, I have reviewed and discussed with patient certain preventive protocols, quality metrics, and best practice recommendations. A written personalized care plan  for preventive services as well as general preventive health recommendations were provided to patient.     Kellie Simmering, LPN   [id number]   Nurse Notes: none  Due to this being a virtual visit, the after visit summary with patients personalized plan was offered to patient via mail or my-chart.  to pick up at office at next visit

## 2022-11-07 NOTE — Patient Instructions (Signed)
[name] , Thank you for taking time to come for your Medicare Wellness Visit. I appreciate your ongoing commitment to your health goals. Please review the following plan we discussed and let me know if I can assist you in the future.   These are the goals we discussed:  Goals      Client understands the importance of follow-up with providers by attending scheduled visits     My goal is to get back to walking and being physically active.     Patient Stated     11/07/2022, start walking and lose 10 pounds and maintain it        This is a list of the screening recommended for you and due dates:  Health Maintenance  Topic Date Due   DTaP/Tdap/Td vaccine (2 - Td or Tdap) 12/19/2020   COVID-19 Vaccine (4 - 2023-24 season) 11/18/2022*   Medicare Annual Wellness Visit  11/08/2023   Pneumonia Vaccine  Completed   Flu Shot  Completed   DEXA scan (bone density measurement)  Completed   Hepatitis C Screening: USPSTF Recommendation to screen - Ages 37-79 yo.  Completed   Zoster (Shingles) Vaccine  Completed   HPV Vaccine  Aged Out   Colon Cancer Screening  Discontinued  *Topic was postponed. The date shown is not the original due date.    Advanced directives: Please bring a copy of your POA (Power of Attorney) and/or Living Will to your next appointment.   Conditions/risks identified: none  Next appointment: Follow up in one year for your annual wellness visit    Preventive Care 65 Years and Older, Female Preventive care refers to lifestyle choices and visits with your health care provider that can promote health and wellness. What does preventive care include? A yearly physical exam. This is also called an annual well check. Dental exams once or twice a year. Routine eye exams. Ask your health care provider how often you should have your eyes checked. Personal lifestyle choices, including: Daily care of your teeth and gums. Regular physical activity. Eating a healthy  diet. Avoiding tobacco and drug use. Limiting alcohol use. Practicing safe sex. Taking low-dose aspirin every day. Taking vitamin and mineral supplements as recommended by your health care provider. What happens during an annual well check? The services and screenings done by your health care provider during your annual well check will depend on your age, overall health, lifestyle risk factors, and family history of disease. Counseling  Your health care provider may ask you questions about your: Alcohol use. Tobacco use. Drug use. Emotional well-being. Home and relationship well-being. Sexual activity. Eating habits. History of falls. Memory and ability to understand (cognition). Work and work Statistician. Reproductive health. Screening  You may have the following tests or measurements: Height, weight, and BMI. Blood pressure. Lipid and cholesterol levels. These may be checked every 5 years, or more frequently if you are over 52 years old. Skin check. Lung cancer screening. You may have this screening every year starting at age 58 if you have a 30-pack-year history of smoking and currently smoke or have quit within the past 15 years. Fecal occult blood test (FOBT) of the stool. You may have this test every year starting at age 63. Flexible sigmoidoscopy or colonoscopy. You may have a sigmoidoscopy every 5 years or a colonoscopy every 10 years starting at age 24. Hepatitis C blood test. Hepatitis B blood test. Sexually transmitted disease (STD) testing. Diabetes screening. This is done by checking your  blood sugar (glucose) after you have not eaten for a while (fasting). You may have this done every 1-3 years. Bone density scan. This is done to screen for osteoporosis. You may have this done starting at age 44. Mammogram. This may be done every 1-2 years. Talk to your health care provider about how often you should have regular mammograms. Talk with your health care provider about  your test results, treatment options, and if necessary, the need for more tests. Vaccines  Your health care provider may recommend certain vaccines, such as: Influenza vaccine. This is recommended every year. Tetanus, diphtheria, and acellular pertussis (Tdap, Td) vaccine. You may need a Td booster every 10 years. Zoster vaccine. You may need this after age 16. Pneumococcal 13-valent conjugate (PCV13) vaccine. One dose is recommended after age 42. Pneumococcal polysaccharide (PPSV23) vaccine. One dose is recommended after age 20. Talk to your health care provider about which screenings and vaccines you need and how often you need them. This information is not intended to replace advice given to you by your health care provider. Make sure you discuss any questions you have with your health care provider. Document Released: 09/23/2015 Document Revised: 05/16/2016 Document Reviewed: 06/28/2015 Elsevier Interactive Patient Education  2017 [employer] Prevention in the Home Falls can cause injuries. They can happen to people of all ages. There are many things you can do to make your home safe and to help prevent falls. What can I do on the outside of my home? Regularly fix the edges of walkways and driveways and fix any cracks. Remove anything that might make you trip as you walk through a door, such as a raised step or threshold. Trim any bushes or trees on the path to your home. Use bright outdoor lighting. Clear any walking paths of anything that might make someone trip, such as rocks or tools. Regularly check to see if handrails are loose or broken. Make sure that both sides of any steps have handrails. Any raised decks and porches should have guardrails on the edges. Have any leaves, snow, or ice cleared regularly. Use sand or salt on walking paths during winter. Clean up any spills in your garage right away. This includes oil or grease spills. What can I do in the bathroom? Use  night lights. Install grab bars by the toilet and in the tub and shower. Do not use towel bars as grab bars. Use non-skid mats or decals in the tub or shower. If you need to sit down in the shower, use a plastic, non-slip stool. Keep the floor dry. Clean up any water that spills on the floor as soon as it happens. Remove soap buildup in the tub or shower regularly. Attach bath mats securely with double-sided non-slip rug tape. Do not have throw rugs and other things on the floor that can make you trip. What can I do in the bedroom? Use night lights. Make sure that you have a light by your bed that is easy to reach. Do not use any sheets or blankets that are too big for your bed. They should not hang down onto the floor. Have a firm chair that has side arms. You can use this for support while you get dressed. Do not have throw rugs and other things on the floor that can make you trip. What can I do in the kitchen? Clean up any spills right away. Avoid walking on wet floors. Keep items that you use a lot in  easy-to-reach places. If you need to reach something above you, use a strong step stool that has a grab bar. Keep electrical cords out of the way. Do not use floor polish or wax that makes floors slippery. If you must use wax, use non-skid floor wax. Do not have throw rugs and other things on the floor that can make you trip. What can I do with my stairs? Do not leave any items on the stairs. Make sure that there are handrails on both sides of the stairs and use them. Fix handrails that are broken or loose. Make sure that handrails are as long as the stairways. Check any carpeting to make sure that it is firmly attached to the stairs. Fix any carpet that is loose or worn. Avoid having throw rugs at the top or bottom of the stairs. If you do have throw rugs, attach them to the floor with carpet tape. Make sure that you have a light switch at the top of the stairs and the bottom of the  stairs. If you do not have them, ask someone to add them for you. What else can I do to help prevent falls? Wear shoes that: Do not have high heels. Have rubber bottoms. Are comfortable and fit you well. Are closed at the toe. Do not wear sandals. If you use a stepladder: Make sure that it is fully opened. Do not climb a closed stepladder. Make sure that both sides of the stepladder are locked into place. Ask someone to hold it for you, if possible. Clearly mark and make sure that you can see: Any grab bars or handrails. First and last steps. Where the edge of each step is. Use tools that help you move around (mobility aids) if they are needed. These include: Canes. Walkers. Scooters. Crutches. Turn on the lights when you go into a dark area. Replace any light bulbs as soon as they burn out. Set up your furniture so you have a clear path. Avoid moving your furniture around. If any of your floors are uneven, fix them. If there are any pets around you, be aware of where they are. Review your medicines with your doctor. Some medicines can make you feel dizzy. This can increase your chance of falling. Ask your doctor what other things that you can do to help prevent falls. This information is not intended to replace advice given to you by your health care provider. Make sure you discuss any questions you have with your health care provider. Document Released: 06/23/2009 Document Revised: 02/02/2016 Document Reviewed: 10/01/2014 Elsevier Interactive Patient Education  2017 [employer].

## 2022-11-13 NOTE — Telephone Encounter (Signed)
Left detailed message on VM per DPR - will discuss warfarin vs pradaxa at apt 3/12

## 2022-11-20 ENCOUNTER — Ambulatory Visit: Payer: Medicare Other | Attending: Cardiovascular Disease | Admitting: Pharmacist

## 2022-11-20 VITALS — BP 128/60 | HR 87

## 2022-11-20 DIAGNOSIS — E785 Hyperlipidemia, unspecified: Secondary | ICD-10-CM

## 2022-11-20 DIAGNOSIS — I4819 Other persistent atrial fibrillation: Secondary | ICD-10-CM

## 2022-11-20 DIAGNOSIS — I10 Essential (primary) hypertension: Secondary | ICD-10-CM | POA: Diagnosis not present

## 2022-11-20 MED ORDER — DABIGATRAN ETEXILATE MESYLATE 150 MG PO CAPS: 150.0000 mg | ORAL_CAPSULE | Freq: Two times a day (BID) | ORAL | 11 refills | Status: DC

## 2022-11-20 NOTE — Progress Notes (Unsigned)
Patient ID: Pattijo Ennes                 DOB: 12/20/1945                      MRN: [id number]      HPI: Johnson Terrana is a 77 y.o. female patient of Dr. Irish Lack referred by Dr. Ambrose Pancoast, NP to HTN clinic. PMH is significant for HLD, aortic stenosis, paroxysmal AF, PVCs, MVP and HTN. Was seen by Jaquelyn Bitter on 07/31/22. BP was 158/76. HCTZ was increased from 12.'5mg'$  daily to '25mg'$  daily.  Seen in hypertension clinic on 08/15/2022, blood pressure at home was between 140s and 150s.  Amlodipine 2.5 mg daily was started.  Blood pressure few weeks later at appointment with Dr. Koren Bound was 130/84.  Metoprolol was DC'd to determine if her fatigue was from beta-blockers or A-fib.  Fatigue improved after stopping metoprolol and heart rates were controlled off of this medication as well her monitor.  At visit 09/20/2022 patient reported stopping Pepsi, but not yet increasing her exercise. She has 3 goals for herself 1 is to quit Pepsi, two is to read the entire Bible and 3 is to increase her exercise. Average home BP is 138/73 heart rate 83.  At visit 11/05/2022 with Dr. Irish Lack, switching to warfarin in March was discussed due to the Eliquis program removing her. Currently on Eliquis for stroke prevention.   At today's visit patient reported a cold (sinus infection) a month ago and hadn't been feeling her best. Some lightheadedness in the morning while she is getting dressed, mostly on Sundays when she has a lot of activity. Brought her home cuff in today. Denies headaches, blurry vision, dizziness. Endorses some flushing in cheeks (bright red). Has started exercising - light weights, cycling. She is concerned increasing exercise will worsen her afib. Home readings average 142/71 HR 84. Patient expressed some concern over recent lipid panel in February. Currently on simvastatin '20mg'$ . Discussed switching from Eliquis to either dabigatran or warfarin. Patient would like to move forward with dabigatran $60/mo with  [facility].   Home cuff:  157/77 (right arm)  149/69 (right arm)   After sitting for ~10 minutes:  127/71 (home cuff: right arm)  128/60 (clinic: right arm)   Current HTN meds: hydrochlorothiazide '25mg'$  daily, amlodipine '5mg'$  daily Previously tried: metoprolol tartrate '25mg'$  BID BP goal: <130/80  Family History:  Family History  Problem Relation Age of Onset   Hypertension Mother    Hyperlipidemia Mother    Stroke Mother    Hiatal hernia Mother    Arthritis Mother    Heart disease Mother        PTVDP-ss syndrome (Dr Elisabeth Cara)   Hemochromatosis Father    Hyperlipidemia Father    Hypertension Father    Heart disease Father        CAD/CABG   Heart disease Brother        AAA Stent, PVD s/p stent, CAD, lipids   Breast cancer Maternal Grandmother 92    Social History: no ETOH, no tobacco  Diet: stopped pepsi completely  Breakfast: bran cereal w/ milk, oranges Lunch: sandwich w/ rotisserie chicken, tomato soup, pimento cheese sandwich Dinner: salad once in awhile, spagetti with meat sauce (canned), frozen rice dishes Snacks: fruit Drinks: water, zero sprite (mini can)  Exercise: some weights, cycling (15 minutes, 2 days/week), thigh machine  Walking some on Sunday's.    Home BP readings:   systolic dyastolic HR   [id number]  $'70 88   145 70 86   138 69 83   129 64 88   141 73 94   141 70 82   159 76 81   133 71 72   143 78 'f$ 80   157 71 86   145 74 86  Average 142.3636 71.4545 84.18182    Wt Readings from Last 3 Encounters:  11/07/22 191 lb (86.6 kg)  11/05/22 190 lb (86.2 kg)  11/02/22 191 lb (86.6 kg)   BP Readings from Last 3 Encounters:  11/20/22 128/60  11/05/22 (!) 162/82  11/02/22 (!) 120/100   Pulse Readings from Last 3 Encounters:  11/20/22 87  11/05/22 96  11/02/22 92    Renal function: CrCl cannot be calculated (Patient's most recent lab result is older than the maximum 21 days allowed.).  Past Medical History:  Diagnosis Date   Dysphagia     Fibrocystic breast disease    GERD (gastroesophageal reflux disease)    History of cystopexy    History of migraines    Hyperlipemia    IBS (irritable bowel syndrome)    Mononucleosis    MVP (mitral valve prolapse)    Positional vertigo    PVC (premature ventricular contraction)    Routine general medical examination at a health care facility     Current Outpatient Medications on File Prior to Visit  Medication Sig Dispense Refill   amLODipine (NORVASC) 5 MG tablet Take 1 tablet (5 mg total) by mouth daily. 90 tablet 3   benzonatate (TESSALON) 200 MG capsule Take 1 capsule (200 mg total) by mouth 2 (two) times daily as needed for cough. 60 capsule 0   Cholecalciferol (VITAMIN D) 50 MCG (2000 UT) CAPS Take 2,000 Units by mouth daily.     Cyanocobalamin (B-12) 1000 MCG TABS Take 1,000 mcg by mouth daily.     estradiol (VIVELLE-DOT) 0.1 MG/24HR patch Place 0.5 patches onto the skin 2 (two) times a week.     famotidine (PEPCID) 20 MG tablet Take 20 mg by mouth daily.     Flaxseed, Linseed, (FLAX SEED OIL PO) Take 15 mLs by mouth daily.     fluticasone (FLONASE) 50 MCG/ACT nasal spray Place 2 sprays into both nostrils daily as needed for allergies or rhinitis. 48 g 3   hydrochlorothiazide (HYDRODIURIL) 25 MG tablet Take 1 tablet (25 mg total) by mouth daily. 90 tablet 1   loratadine (CLARITIN) 10 MG tablet Take 10 mg by mouth daily.     meclizine (ANTIVERT) 12.5 MG tablet Take 1 tablet (12.5 mg total) by mouth 3 (three) times daily as needed for dizziness. 30 tablet 3   polyethylene glycol (MIRALAX / GLYCOLAX) 17 g packet Take 17 g by mouth See admin instructions. Mon, Tu Wed Th and Sat ONLY     simethicone (MYLICON) 0000000 MG chewable tablet Chew 125 mg by mouth daily. Patient has tablet form. Swallow whole     simvastatin (ZOCOR) 20 MG tablet TAKE 1 TABLET BY MOUTH EVERYDAY AT BEDTIME 90 tablet 3   No current facility-administered medications on file prior to visit.    Allergies  Allergen  Reactions   Morphine And Related Hives and Nausea And Vomiting   Penicillins    Zetia [Ezetimibe]     Made IBS worse    Blood pressure 128/60, pulse 87, SpO2 96 %.   Assessment/Plan:      1. Hypertension -   Hypertension Assessment: Patient brought home cuff and readings to visit. Home  BP average: 142/71 HR 84.  Home cuff 157/77. After sitting for ~10 minutes, home cuff was 127/71. Clinic BP 128/60.  Patient reports some lightheadedness in the morning while she is getting dressed, occurs mostly on Sundays when she has a lot of errands to do.  Patient has started exercising more since last visit - light weights (bicep curls, rows), cycling (twice/weekly), walking on Sunday.  Patient expressed concern over increasing her exercise. Worried it will worsen her afib.  Recommended a medication change for BP, but patient requested to work on lifestyle instead.   Plan:  Encouraged patient to increase her cycling to more than twice/week.  Continue walking and using light weights Discussed the benefit of exercise and how it can be beneficial in afib, lowering triglycerides, increasing insulin sensitivity  Continue HCTZ '25mg'$  and amlodipine '5mg'$  Follow-up Thursday, April 18th at 3:30 PM  Atrial fibrillation [facility]) Assessment:  Discussed switching from Eliquis to either dabigatran or warfarin.  Patient wanted to move forward with dabigatran for $60/mo using [facility] at [facility].   Plan:  Start dabigatran '150mg'$  BID once Eliquis is completed.  Keep tablets in the original container.   Hyperlipidemia Assessment: Patient brought a copy of her lipid panel with her and asked [place] to look at it TG high along with LDL-C Previously intolerant to higher dose simvastatin and zetia (Vytorin) On simvastatin '20mg'$  currently Upset that her # did not improve after stopping her daily Pepsi PCP offered her fenofibrate but she declined  Plan: Discussed with patient trying rosuvastatin '5mg'$   daily Patient declined changing therapy due to fear of side effects Vascepa would be cost prohibitive Encouraged increase in exercise to help improve insulin sensitivity and lower TG   Thank you,  Wallene Huh, PharmD Candidate   Ramond Dial, Pharm.D, BCPS, CPP [facility] A Division of [facility] [address] [place], [place] [address]  Phone: [phone]; Fax: [phone]

## 2022-11-20 NOTE — Assessment & Plan Note (Addendum)
Assessment: Patient brought home cuff and readings to visit. Home BP average: 142/71 HR 84.  Home cuff 157/77. After sitting for ~10 minutes, home cuff was 127/71. Clinic BP 128/60.  Patient reports some lightheadedness in the morning while she is getting dressed, occurs mostly on Sundays when she has a lot of errands to do.  Patient has started exercising more since last visit - light weights (bicep curls, rows), cycling (twice/weekly), walking on Sunday.  Patient expressed concern over increasing her exercise. Worried it will worsen her afib.  Recommended a medication change for BP, but patient requested to work on lifestyle instead.   Plan:  Encouraged patient to increase her cycling to more than twice/week.  Continue walking and using light weights Discussed the benefit of exercise and how it can be beneficial in afib, lowering triglycerides, increasing insulin sensitivity  Continue HCTZ 25mg  and amlodipine 5mg  Follow-up Thursday, April 18th at 3:30 PM

## 2022-11-20 NOTE — Patient Instructions (Addendum)
Summary of today's discussion  1.Continue hydrochlorothiazide '25mg'$  daily, amlodipine '5mg'$  daily   2.Please increase your exercise - work on increasing bike to 5 times a week  3.Continuing checking blood pressure  4.Recommending switching from simvastatin to rosuvastatin  5.Start dabigatran (pradaxa) after you finish Eliquis when you would be due for your next Eliquis dose.    Your blood pressure goal is <130/80  To check your pressure at home you will need to:  1. Sit up in a chair, with feet flat on the floor and back supported. Do not cross your ankles or legs. 2. Rest your left arm so that the cuff is about heart level. If the cuff goes on your upper arm,  then just relax the arm on the table, arm of the chair or your lap. If you have a wrist cuff, we  suggest relaxing your wrist against your chest (think of it as Pledging the Flag with the  wrong arm).  3. Place the cuff snugly around your arm, about 1 inch above the crook of your elbow. The  cords should be inside the groove of your elbow.  4. Sit quietly, with the cuff in place, for about 5 minutes. After that 5 minutes press the power  button to start a reading. 5. Do not talk or move while the reading is taking place.  6. Record your readings on a sheet of paper. Although most cuffs have a memory, it is often  easier to see a pattern developing when the numbers are all in front of you.  7. You can repeat the reading after 1-3 minutes if it is recommended  Make sure your bladder is empty and you have not had caffeine or tobacco within the last 30 min  Always bring your blood pressure log with you to your appointments. If you have not brought your monitor in to be double checked for accuracy, please bring it to your next appointment.  You can find a list of validated (accurate) blood pressure cuffs at [employer]   Important lifestyle changes to control high blood pressure  Intervention  Effect on the BP  Lose extra pounds  and watch your waistline Weight loss is one of the most effective lifestyle changes for controlling blood pressure. If you're overweight or obese, losing even a small amount of weight can help reduce blood pressure. Blood pressure might go down by about 1 millimeter of mercury (mm Hg) with each kilogram (about 2.2 pounds) of weight lost.  Exercise regularly As a general goal, aim for at least 30 minutes of moderate physical activity every day. Regular physical activity can lower high blood pressure by about 5 to 8 mm Hg.  Eat a healthy diet Eating a diet rich in whole grains, fruits, vegetables, and low-fat dairy products and low in saturated fat and cholesterol. A healthy diet can lower high blood pressure by up to 11 mm Hg.  Reduce salt (sodium) in your diet Even a small reduction of sodium in the diet can improve heart health and reduce high blood pressure by about 5 to 6 mm Hg.  Limit alcohol One drink equals 12 ounces of beer, 5 ounces of wine, or 1.5 ounces of 80-proof liquor.  Limiting alcohol to less than one drink a day for women or two drinks a day for men can help lower blood pressure by about 4 mm Hg.   Please call me at [phone] with any questions.

## 2022-11-20 NOTE — Assessment & Plan Note (Signed)
Assessment:  Discussed switching from Eliquis to either dabigatran or warfarin.  Patient wanted to move forward with dabigatran for $60/mo using [facility] at [facility].   Plan:  Start dabigatran '150mg'$  BID once Eliquis is completed.  Keep tablets in the original container.

## 2022-11-21 NOTE — Assessment & Plan Note (Signed)
Assessment: Patient brought a copy of her lipid panel with her and asked [place] to look at it TG high along with LDL-C Previously intolerant to higher dose simvastatin and zetia (Vytorin) On simvastatin '20mg'$  currently Upset that her # did not improve after stopping her daily Pepsi PCP offered her fenofibrate but she declined  Plan: Discussed with patient trying rosuvastatin '5mg'$  daily Patient declined changing therapy due to fear of side effects Vascepa would be cost prohibitive Encouraged increase in exercise to help improve insulin sensitivity and lower TG

## 2022-12-27 ENCOUNTER — Ambulatory Visit: Payer: Medicare Other | Attending: Cardiovascular Disease | Admitting: Pharmacist

## 2022-12-27 VITALS — BP 132/70 | HR 89

## 2022-12-27 DIAGNOSIS — I10 Essential (primary) hypertension: Secondary | ICD-10-CM

## 2022-12-27 NOTE — Progress Notes (Signed)
Patient ID: Olivia Mendoza                 DOB: 1946-08-11                      MRN: [id number]      HPI: Olivia Mendoza is a 77 y.o. female patient of Dr. Eldridge Dace referred by Dr. Robin Searing, NP to HTN clinic. PMH is significant for HLD, aortic stenosis, paroxysmal AF, PVCs, MVP and HTN. Was seen by Alden Server on 07/31/22. BP was 158/76. HCTZ was increased from 12.5mg  daily to  daily.  Seen in hypertension clinic on 08/15/2022, blood pressure at home was between 140s and 150s.  Amlodipine 2.5 mg daily was started.  Blood pressure few weeks later at appointment with Dr. Shirlyn Goltz was 130/84.  Metoprolol was DC'd to determine if her fatigue was from beta-blockers or A-fib.  Fatigue improved after stopping metoprolol and heart rates were controlled off of this medication as well her monitor.  At visit 09/20/2022 patient reported stopping Pepsi, but not yet increasing her exercise. She has 3 goals for herself 1 is to quit Pepsi, two is to read the entire Bible and 3 is to increase her exercise. Average home BP is 138/73 heart rate 83.  At visit 11/05/2022 with Dr. Eldridge Dace, switching to warfarin in March was discussed due to the Eliquis program removing her. Currently on Eliquis for stroke prevention.   At last visit it was recommended that patient increase her physical activity. Recommended increasing blood pressure medications but patient wished to defer.   Patient presents today to clinic for follow up. She has started walking 3 days a week. She brings in a list of BP readings averaging 124/67. She has been resting 10 min prior to checking.  She started dabigatran last night.  So far no issues.  Blood pressure in clinic today 132/70.  After sitting for ~10 minutes:  127/71 (home cuff: right arm)  128/60 (clinic: right arm)   Current HTN meds: hydrochlorothiazide  daily, amlodipine  daily Previously tried: metoprolol tartrate  BID BP goal: <130/80  Family History:  Family History  Problem  Relation Age of Onset   Hypertension Mother    Hyperlipidemia Mother    Stroke Mother    Hiatal hernia Mother    Arthritis Mother    Heart disease Mother        PTVDP-ss syndrome (Dr Lynnea Ferrier)   Hemochromatosis Father    Hyperlipidemia Father    Hypertension Father    Heart disease Father        CAD/CABG   Heart disease Brother        AAA Stent, PVD s/p stent, CAD, lipids   Breast cancer Maternal Grandmother 90    Social History: no ETOH, no tobacco  Diet: stopped pepsi completely  Breakfast: bran cereal w/ milk, oranges Lunch: sandwich w/ rotisserie chicken, tomato soup, pimento cheese sandwich Dinner: salad once in awhile, spagetti with meat sauce (canned), frozen rice dishes Snacks: fruit Drinks: water, zero sprite (mini can)  Exercise: some weights, cycling (15 minutes, 2 days/week), thigh machine  Walking some on Sunday's. Walking 3 days a week  Home BP readings:  78  131 71  120 70  120 70  126 64  119  68  125 60  121 76  124 72  125 70  124 67  124.1304 16.10960    Wt Readings from Last 3 Encounters:  11/07/22 191 lb (86.6 kg)  11/05/22 190 lb (86.2 kg)  11/02/22 191 lb (86.6 kg)   BP Readings from Last 3 Encounters:  12/27/22 132/70  11/20/22 128/60  11/05/22 (!) 162/82   Pulse Readings from Last 3 Encounters:  12/27/22 89  11/20/22 87  11/05/22 96    Renal function: CrCl cannot be calculated (Patient's most recent lab result is older than the maximum 21 days allowed.).  Past Medical History:  Diagnosis Date   Dysphagia    Fibrocystic breast disease    GERD (gastroesophageal reflux disease)    History of cystopexy    History of migraines    Hyperlipemia    IBS (irritable bowel syndrome)    Mononucleosis    MVP (mitral valve prolapse)    Positional vertigo    PVC (premature ventricular contraction)    Routine general medical  examination at a health care facility     Current Outpatient Medications on File Prior to Visit  Medication Sig Dispense Refill   amLODipine (NORVASC) 5 MG tablet Take 1 tablet (5 mg total) by mouth daily. 90 tablet 3   benzonatate (TESSALON) 200 MG capsule Take 1 capsule (200 mg total) by mouth 2 (two) times daily as needed for cough. 60 capsule 0   Cholecalciferol (VITAMIN D) 50 MCG (2000 UT) CAPS Take 2,000 Units by mouth daily.     Cyanocobalamin (B-12) 1000 MCG TABS Take 1,000 mcg by mouth daily.     dabigatran (PRADAXA) 150 MG CAPS capsule Take 1 capsule (150 mg total) by mouth 2 (two) times daily. 60 capsule 11   estradiol (VIVELLE-DOT) 0.1 MG/24HR patch Place 0.5 patches onto the skin 2 (two) times a week.     famotidine (PEPCID) 20 MG tablet Take 20 mg by mouth daily.     Flaxseed, Linseed, (FLAX SEED OIL PO) Take 15 mLs by mouth daily.     fluticasone (FLONASE) 50 MCG/ACT nasal spray Place 2 sprays into both nostrils daily as needed for allergies or rhinitis. 48 g 3   hydrochlorothiazide (HYDRODIURIL) 25 MG tablet Take 1 tablet (25 mg total) by mouth daily. 90 tablet 1   loratadine (CLARITIN) 10 MG tablet Take 10 mg by mouth daily.     meclizine (ANTIVERT) 12.5 MG tablet Take 1 tablet (12.5 mg total) by mouth 3 (three) times daily as needed for dizziness. 30 tablet 3   polyethylene glycol (MIRALAX / GLYCOLAX) 17 g packet Take 17 g by mouth See admin instructions. Mon, Tu Wed Th and Sat ONLY     simethicone (MYLICON) 125 MG chewable tablet Chew 125 mg by mouth daily. Patient has tablet form. Swallow whole     simvastatin (ZOCOR) 20 MG tablet TAKE 1 TABLET BY MOUTH EVERYDAY AT BEDTIME 90 tablet 3   No current facility-administered medications on file prior to visit.    Allergies  Allergen Reactions   Morphine And Related Hives and Nausea And Vomiting   Penicillins    Zetia [Ezetimibe]     Made IBS worse    Blood pressure 132/70, pulse 89.   Assessment/Plan:      1.  Hypertension -   Hypertension Assessment: Blood pressure close to goal in clinic today and at goal consistently at home. Patient has increased her physical activity walking about 3 days a week.  Plan:  Continue hydrochlorothiazide 25 mg daily and amlodipine 5 mg daily Continue walking and try to increase as able Follow-up as needed   Thank you,  Delena Serve, PharmD Candidate   Olene Floss, Pharm.D, BCPS, CPP [facility] A Division of [facility] [address] [place], [place] [address]  Phone: [phone]; Fax: [phone]

## 2022-12-27 NOTE — Patient Instructions (Addendum)
Continue hydrochlorothiazide  daily and amlodipine  daily Continue walking. Try to increasing walking. Ideally walking most days of the week for 30 min total per day.

## 2022-12-27 NOTE — Assessment & Plan Note (Signed)
Assessment: Blood pressure close to goal in clinic today and at goal consistently at home. Patient has increased her physical activity walking about 3 days a week.  Plan: Continue hydrochlorothiazide 25 mg daily and amlodipine 5 mg daily Continue walking and try to increase as able Follow-up as needed

## 2023-02-06 ENCOUNTER — Other Ambulatory Visit: Payer: Self-pay | Admitting: Nurse Practitioner

## 2023-02-06 DIAGNOSIS — Z6830 Body mass index (BMI) 30.0-30.9, adult: Secondary | ICD-10-CM | POA: Diagnosis not present

## 2023-02-06 DIAGNOSIS — N959 Unspecified menopausal and perimenopausal disorder: Secondary | ICD-10-CM | POA: Diagnosis not present

## 2023-02-06 DIAGNOSIS — Z01419 Encounter for gynecological examination (general) (routine) without abnormal findings: Secondary | ICD-10-CM | POA: Diagnosis not present

## 2023-02-28 DIAGNOSIS — H5213 Myopia, bilateral: Secondary | ICD-10-CM | POA: Diagnosis not present

## 2023-02-28 DIAGNOSIS — H2513 Age-related nuclear cataract, bilateral: Secondary | ICD-10-CM | POA: Diagnosis not present

## 2023-06-10 ENCOUNTER — Ambulatory Visit: Payer: Medicare Other | Admitting: Interventional Cardiology

## 2023-06-19 NOTE — Progress Notes (Unsigned)
Cardiology Office Note    Patient Name: Olivia Mendoza Date of Encounter: 06/19/2023  Primary Care Provider:  Myrlene Broker, MD Primary Cardiologist:  Lance Muss, MD Primary Electrophysiologist: Maurice Small, MD   Past Medical History    Past Medical History:  Diagnosis Date   Dysphagia    Fibrocystic breast disease    GERD (gastroesophageal reflux disease)    History of cystopexy    History of migraines    Hyperlipemia    IBS (irritable bowel syndrome)    Mononucleosis    MVP (mitral valve prolapse)    Positional vertigo    PVC (premature ventricular contraction)    Routine general medical examination at a health care facility     History of Present Illness  Olivia Mendoza is a 77 y.o. female with PMH of HLD, aortic stenosis, paroxysmal AF, PVCs, MVP, who presents today for follow-up of atrial fibrillation and hypertension.  Ms. Remington was last seen on 11/05/2022 by Dr. Eldridge Dace was with 2 consider switching to warfarin due to expense of Eliquis.  Blood pressure was remaining elevated and patient was recommended to increase physical activity since patient deferred increase in BP medication.  She decided to start Pradaxa as an alternative and was seen in follow-up by our Pharm.D. on 12/27/2022.  She reported exercising with walking 3 days a week home BPs were averaging in the 120s over 60s.  Issues with Pradaxa and no changes were made to her current medication regimen.  She was continued with HCTZ 25 mg daily and amlodipine 5 mg daily.  During today's visit the patient reports*** .  Patient denies chest pain, palpitations, dyspnea, PND, orthopnea, nausea, vomiting, dizziness, syncope, edema, weight gain, or early satiety.  ***Notes: -Last ischemic evaluation: -Last echo: -Interim ED visits: Review of Systems  Please see the history of present illness.    All other systems reviewed and are otherwise negative except as noted above.  Physical Exam    Wt  Readings from Last 3 Encounters:  11/07/22 191 lb (86.6 kg)  11/05/22 190 lb (86.2 kg)  11/02/22 191 lb (86.6 kg)   YN:WGNFA were no vitals filed for this visit.,There is no height or weight on file to calculate BMI. GEN: Well nourished, well developed in no acute distress Neck: No JVD; No carotid bruits Pulmonary: Clear to auscultation without rales, wheezing or rhonchi  Cardiovascular: Normal rate. Regular rhythm. Normal S1. Normal S2.   Murmurs: There is no murmur.  ABDOMEN: Soft, non-tender, non-distended EXTREMITIES:  No edema; No deformity   EKG/LABS/ Recent Cardiac Studies   ECG personally reviewed by me today - ***  Risk Assessment/Calculations:   {Does this patient have ATRIAL FIBRILLATION?:[phone]}      Lab Results  Component Value Date   WBC 6.2 05/23/2022   HGB 15.7 05/23/2022   HCT 46.4 05/23/2022   MCV 89 05/23/2022   PLT 312 05/23/2022   Lab Results  Component Value Date   CREATININE 0.96 08/21/2022   BUN 9 08/21/2022   NA 137 08/21/2022   K 4.0 08/21/2022   CL 97 08/21/2022   CO2 24 08/21/2022   Lab Results  Component Value Date   CHOL 248 (H) 11/02/2022   HDL 57.10 11/02/2022   LDLCALC 125 (H) 08/25/2015   LDLDIRECT 134.0 11/02/2022   TRIG 371.0 (H) 11/02/2022   CHOLHDL 4 11/02/2022    Lab Results  Component Value Date   HGBA1C 5.8 11/02/2022   Assessment & Plan    1.Paroxysmal  atrial fibrillation: -DCCV completed on 9/19 and was cardioverted to normal sinus rhythm however converted to atrial flutter 2 days later.  She was seen by EP and rates were well-controlled by Zio patch after discontinuing metoprolol with no plan to pursue rhythm control -Patient recently switched to Pradaxa due to cost inhibitions with Eliquis.  2.  Essential hypertension: -Today patient's blood pressure is*** -Patient recently increased physical activity and***  3.Mitral regurgitation: -Regurgitation on most previous 2D echo showed moderate to mild with  recommendation of annual follow-up for valve disease  4.Hyperlipidemia: -Patient's last LDL was       Disposition: Follow-up with Lance Muss, MD or APP in *** months {Are you ordering a CV Procedure (e.g. stress test, cath, DCCV, TEE, etc)?   Press F2        :[id number]}   Signed, Napoleon Form, Leodis Rains, NP 06/19/2023, 11:12 AM  [facility]

## 2023-06-20 ENCOUNTER — Encounter: Payer: Self-pay | Admitting: Nurse Practitioner

## 2023-06-20 ENCOUNTER — Ambulatory Visit: Payer: Medicare Other | Attending: Interventional Cardiology | Admitting: Nurse Practitioner

## 2023-06-20 VITALS — BP 138/68 | HR 92 | Ht 66.5 in | Wt 188.2 lb

## 2023-06-20 DIAGNOSIS — I341 Nonrheumatic mitral (valve) prolapse: Secondary | ICD-10-CM | POA: Diagnosis not present

## 2023-06-20 DIAGNOSIS — I340 Nonrheumatic mitral (valve) insufficiency: Secondary | ICD-10-CM | POA: Insufficient documentation

## 2023-06-20 DIAGNOSIS — I4819 Other persistent atrial fibrillation: Secondary | ICD-10-CM | POA: Insufficient documentation

## 2023-06-20 DIAGNOSIS — I10 Essential (primary) hypertension: Secondary | ICD-10-CM | POA: Insufficient documentation

## 2023-06-20 MED ORDER — DILTIAZEM HCL ER COATED BEADS 180 MG PO CP24: 180.0000 mg | ORAL_CAPSULE | Freq: Every day | ORAL | 3 refills | Status: DC

## 2023-06-20 NOTE — Patient Instructions (Signed)
Medication Instructions:  START Cardizem 180mg  Take 1 tablet once a day  *If you need a refill on your cardiac medications before your next appointment, please call your pharmacy*   Lab Work: TODAY-BMET & CBC If you have labs (blood work) drawn today and your tests are completely normal, you will receive your results only by: MyChart Message (if you have MyChart) OR A paper copy in the mail If you have any lab test that is abnormal or we need to change your treatment, we will call you to review the results.   Testing/Procedures: NONE ORDERED    Follow-Up: At [facility], you and your health needs are our priority.  As part of our continuing mission to provide you with exceptional heart care, we have created designated Provider Care Teams.  These Care Teams include your primary Cardiologist (physician) and Advanced Practice Providers (APPs -  Physician Assistants and Nurse Practitioners) who all work together to provide you with the care you need, when you need it.  We recommend signing up for the patient portal called "MyChart".  Sign up information is provided on this After Visit Summary.  MyChart is used to connect with patients for Virtual Visits (Telemedicine).  Patients are able to view lab/test results, encounter notes, upcoming appointments, etc.  Non-urgent messages can be sent to your provider as well.   To learn more about what you can do with MyChart, go to [email or web address].    Your next appointment:    FOLLOW UP AS SCHEDULED  Provider:   Charlton Haws, MD  Other Instructions

## 2023-06-21 LAB — BASIC METABOLIC PANEL
BUN/Creatinine Ratio: 22 (ref 12–28)
BUN: 18 mg/dL (ref 8–27)
CO2: 25 mmol/L (ref 20–29)
Calcium: 9.4 mg/dL (ref 8.7–10.3)
Chloride: 99 mmol/L (ref 96–106)
Creatinine, Ser: 0.83 mg/dL (ref 0.57–1.00)
Glucose: 96 mg/dL (ref 70–99)
Potassium: 3.5 mmol/L (ref 3.5–5.2)
Sodium: 140 mmol/L (ref 134–144)
eGFR: 72 mL/min/{1.73_m2} (ref >59)

## 2023-06-21 LAB — CBC
Hematocrit: 46.8 % — ABNORMAL HIGH (ref 34.0–46.6)
Hemoglobin: 15.1 g/dL (ref 11.1–15.9)
MCH: 29.1 pg (ref 26.6–33.0)
MCHC: 32.3 g/dL (ref 31.5–35.7)
MCV: 90 fL (ref 79–97)
Platelets: 345 10*3/uL (ref 150–450)
RBC: 5.19 x10E6/uL (ref 3.77–5.28)
RDW: 12.8 % (ref 11.7–15.4)
WBC: 6.9 10*3/uL (ref 3.4–10.8)

## 2023-08-05 ENCOUNTER — Other Ambulatory Visit: Payer: Self-pay | Admitting: Interventional Cardiology

## 2023-09-18 NOTE — Progress Notes (Signed)
 Cardiology Office Note    Patient Name: Olivia Mendoza Date of Encounter: 09/25/2023  Primary Care Provider:  Adelia Homestead, MD Primary Cardiologist:  Avery Bodo, MD Primary Electrophysiologist: Efraim Grange, MD   Past Medical History    Past Medical History:  Diagnosis Date   Dysphagia    Fibrocystic breast disease    GERD (gastroesophageal reflux disease)    History of cystopexy    History of migraines    Hyperlipemia    IBS (irritable bowel syndrome)    Mononucleosis    MVP (mitral valve prolapse)    Positional vertigo    PVC (premature ventricular contraction)    Routine general medical examination at a health care facility     History of Present Illness  Olivia Mendoza is a 78 y.o. female with PMH of HLD, aortic stenosis, paroxysmal AF, PVCs, MVP, who presents today for follow-up She is new to me Previously seen by Dr Jacquelynn Matter and EP Mealor   Ms. Jeppsen was seen on 11/05/2022 by Dr. Jacquelynn Matter  BP better with exercise. Taking norvasc , hydrochlorothiazide  and ? Cardizem  She is on statin. She changed from eliquis  to pradaxa  due to cost   Seen by PA 06/20/23 she has been experiencing increased shortness of breath and fatigue with exertion.  Her blood pressure was 138/68 and heart rate was 92 bpm.  She has been tolerating Pradaxa  without any adverse reactions.  She did not tolerate beta blocker and d/c Was supposed to discuss ablation with Dr Arlester Ladd She had [facility] to sinus 05/29/22 but reverted back to afib and appears more rate control after that. Monitor 09/17/22 with continuous afib average rate 80 bpm and felt better off beta blockers. Again Dr Arlester Ladd recommended rate control and not rhythm control   TTE 05/01/22 EF 60-65% moderate AR and mild/moderate MR LA 55 mm read is mildly dilated But this is in severe range  She would like to see Dr Arlester Ladd again to discuss ablation. She wants to wait until her husband has repeat surgery with Veldon German at [facility] for AAA endoleak    Review of Systems  Please see the history of present illness.    All other systems reviewed and are otherwise negative except as noted above.  Physical Exam    Wt Readings from Last 3 Encounters:  09/25/23 186 lb (84.4 kg)  06/20/23 188 lb 3.2 oz (85.4 kg)  11/07/22 191 lb (86.6 kg)   VS: Vitals:   09/25/23 1525  BP: (!) 140/58  Pulse: 82  SpO2: 96%   ,Body mass index is 29.57 kg/m. GEN: Well nourished, well developed in no acute distress Neck: No JVD; No carotid bruits Pulmonary: Clear to auscultation without rales, wheezing or rhonchi  Cardiovascular: Irregularly irregular. Normal S1. Normal S2.   Murmurs: There is no murmur.  ABDOMEN: Soft, non-tender, non-distended EXTREMITIES:  No edema; No deformity   EKG/LABS/ Recent Cardiac Studies   ECG personally reviewed by me today -none completed today  Risk Assessment/Calculations:    CHA2DS2-VASc Score = 4   This indicates a 4.8% annual risk of stroke. The patient's score is based upon: CHF History: 0 HTN History: 1 Diabetes History: 0 Stroke History: 0 Vascular Disease History: 0 Age Score: 2 Gender Score: 1    Lab Results  Component Value Date   WBC 6.9 06/20/2023   HGB 15.1 06/20/2023   HCT 46.8 (H) 06/20/2023   MCV 90 06/20/2023   PLT 345 06/20/2023   Lab Results  Component Value Date  CREATININE 0.83 06/20/2023   BUN 18 06/20/2023   NA 140 06/20/2023   K 3.5 06/20/2023   CL 99 06/20/2023   CO2 25 06/20/2023   Lab Results  Component Value Date   CHOL 248 (H) 11/02/2022   HDL 57.10 11/02/2022   LDLCALC 125 (H) 08/25/2015   LDLDIRECT 134.0 11/02/2022   TRIG 371.0 (H) 11/02/2022   CHOLHDL 4 11/02/2022    Lab Results  Component Value Date   HGBA1C 5.8 11/02/2022   Assessment & Plan    1.Paroxysmal atrial fibrillation: -Prior [facility] 05/2022 but ERAF - Seen by EP multiple times Dr Arlester Ladd rate control no rhythm control. Monitor 09/19/22 good rate control Does not tolerate beta blocker   -CHA2DS2-VASc Score = 4 [CHF History: 0, HTN History: 1, Diabetes History: 0, Stroke History: 0, Vascular Disease History: 0, Age Score: 2, Gender Score: 1].  Therefore, the patient's annual risk of stroke is 4.8 %.     - Continue Pradaxa   - she would like to see him again in 3 months to discuss ablation although my read on prior notes Seems to indicate he thought rate control and anticoagulation was fine   2.  Essential hypertension: -well controlled  -Continue Norvasc  5 mg daily, HCTZ 25 mg daily and Cardizem  180 mg daily  3.Mitral/Aortic  regurgitation: -Moderate AR and mild/mod MR on TTE  05/01/22 - No CHF and normal LV size/function on TTE - update TTE   4.Hyperlipidemia: -Patient's last LDL was 134 above goal.   -Patient is statin intolerant and does not wish to increase Zocor  to 40 mg -She is working on lifestyle modifications in an effort to control her cholesterol.  Disposition: Follow-up with DR Mealor 3 months and general cardiology in a year     Signed, Janelle Mediate, NP 09/25/2023, 3:42 PM [place] [facility] [facility]

## 2023-09-25 ENCOUNTER — Ambulatory Visit: Payer: Medicare Other | Attending: Cardiovascular Disease | Admitting: Cardiovascular Disease

## 2023-09-25 VITALS — BP 140/58 | HR 82 | Ht 66.5 in | Wt 186.0 lb

## 2023-09-25 DIAGNOSIS — I341 Nonrheumatic mitral (valve) prolapse: Secondary | ICD-10-CM | POA: Insufficient documentation

## 2023-09-25 DIAGNOSIS — E785 Hyperlipidemia, unspecified: Secondary | ICD-10-CM | POA: Diagnosis not present

## 2023-09-25 DIAGNOSIS — I4891 Unspecified atrial fibrillation: Secondary | ICD-10-CM | POA: Insufficient documentation

## 2023-09-25 DIAGNOSIS — I10 Essential (primary) hypertension: Secondary | ICD-10-CM | POA: Diagnosis not present

## 2023-09-25 DIAGNOSIS — Z23 Encounter for immunization: Secondary | ICD-10-CM | POA: Diagnosis not present

## 2023-09-25 MED ORDER — DILTIAZEM HCL ER COATED BEADS 180 MG PO CP24: 180.0000 mg | ORAL_CAPSULE | Freq: Every day | ORAL | 3 refills | Status: DC

## 2023-09-25 MED ORDER — SIMVASTATIN 20 MG PO TABS: 20.0000 mg | ORAL_TABLET | Freq: Every day | ORAL | 3 refills | Status: DC

## 2023-09-25 MED ORDER — HYDROCHLOROTHIAZIDE 25 MG PO TABS: 25.0000 mg | ORAL_TABLET | Freq: Every day | ORAL | 3 refills | Status: DC

## 2023-09-25 MED ORDER — DABIGATRAN ETEXILATE MESYLATE 150 MG PO CAPS: 150.0000 mg | ORAL_CAPSULE | Freq: Two times a day (BID) | ORAL | 11 refills | Status: DC

## 2023-09-25 NOTE — Patient Instructions (Signed)
 Medication Instructions:  Your physician recommends that you continue on your current medications as directed. Please refer to the Current Medication list given to you today.  *If you need a refill on your cardiac medications before your next appointment, please call your pharmacy*  Lab Work: If you have labs (blood work) drawn today and your tests are completely normal, you will receive your results only by: MyChart Message (if you have MyChart) OR A paper copy in the mail If you have any lab test that is abnormal or we need to change your treatment, we will call you to review the results.  Testing/Procedures: None ordered today.  Follow-Up: At [facility], you and your health needs are our priority.  As part of our continuing mission to provide you with exceptional heart care, we have created designated Provider Care Teams.  These Care Teams include your primary Cardiologist (physician) and Advanced Practice Providers (APPs -  Physician Assistants and Nurse Practitioners) who all work together to provide you with the care you need, when you need it.  We recommend signing up for the patient portal called "MyChart".  Sign up information is provided on this After Visit Summary.  MyChart is used to connect with patients for Virtual Visits (Telemedicine).  Patients are able to view lab/test results, encounter notes, upcoming appointments, etc.  Non-urgent messages can be sent to your provider as well.   To learn more about what you can do with MyChart, go to [email or web address].    Your next appointment:   1 year(s)  Provider:   Janelle Mediate, MD     Other Instructions Your physician recommends that you schedule a follow-up appointment in: 3 months with Dr. Arlester Ladd to discuss ablation.

## 2023-09-29 ENCOUNTER — Other Ambulatory Visit: Payer: Self-pay | Admitting: Interventional Cardiology

## 2023-10-01 MED ORDER — DILTIAZEM HCL ER COATED BEADS 240 MG PO CP24: 240.0000 mg | ORAL_CAPSULE | Freq: Every day | ORAL | 3 refills | Status: DC

## 2023-10-01 NOTE — Telephone Encounter (Signed)
Per Dr. Eden Emms, Can stop norvasc and increase cardizem to 240 mg daily. Called patient to let her know. That she needs to stop norvasc and we are increasing her dose of cardizem. Patient verbalized understanding.

## 2023-10-01 NOTE — Telephone Encounter (Signed)
Does pt still suppose to be taking this medication? Please address

## 2023-10-13 ENCOUNTER — Other Ambulatory Visit: Payer: Self-pay | Admitting: Internal Medicine

## 2023-10-18 ENCOUNTER — Other Ambulatory Visit: Payer: Self-pay | Admitting: Nurse Practitioner

## 2023-12-23 ENCOUNTER — Ambulatory Visit: Payer: Medicare Other | Admitting: Cardiovascular Disease

## 2024-01-03 ENCOUNTER — Ambulatory Visit: Admitting: Internal Medicine

## 2024-01-03 ENCOUNTER — Encounter: Payer: Self-pay | Admitting: Internal Medicine

## 2024-01-03 VITALS — BP 138/60 | HR 96 | Temp 98.1°F | Ht 66.5 in | Wt 186.0 lb

## 2024-01-03 DIAGNOSIS — I10 Essential (primary) hypertension: Secondary | ICD-10-CM

## 2024-01-03 DIAGNOSIS — E785 Hyperlipidemia, unspecified: Secondary | ICD-10-CM

## 2024-01-03 DIAGNOSIS — I4819 Other persistent atrial fibrillation: Secondary | ICD-10-CM

## 2024-01-03 DIAGNOSIS — Z0000 Encounter for general adult medical examination without abnormal findings: Secondary | ICD-10-CM | POA: Diagnosis not present

## 2024-01-03 DIAGNOSIS — R7301 Impaired fasting glucose: Secondary | ICD-10-CM

## 2024-01-03 DIAGNOSIS — K219 Gastro-esophageal reflux disease without esophagitis: Secondary | ICD-10-CM

## 2024-01-03 LAB — LIPID PANEL
Cholesterol: 206 mg/dL — ABNORMAL HIGH (ref 0–200)
HDL: 59.9 mg/dL (ref >39.00)
LDL Cholesterol: 86 mg/dL (ref 0–99)
NonHDL: 146.15
Total CHOL/HDL Ratio: 3
Triglycerides: 299 mg/dL — ABNORMAL HIGH (ref 0.0–149.0)
VLDL: 59.8 mg/dL — ABNORMAL HIGH (ref 0.0–40.0)

## 2024-01-03 LAB — COMPREHENSIVE METABOLIC PANEL WITH GFR
ALT: 21 U/L (ref 0–35)
AST: 25 U/L (ref 0–37)
Albumin: 4.5 g/dL (ref 3.5–5.2)
Alkaline Phosphatase: 101 U/L (ref 39–117)
BUN: 17 mg/dL (ref 6–23)
CO2: 30 meq/L (ref 19–32)
Calcium: 10.1 mg/dL (ref 8.4–10.5)
Chloride: 97 meq/L (ref 96–112)
Creatinine, Ser: 0.93 mg/dL (ref 0.40–1.20)
GFR: 58.79 mL/min — ABNORMAL LOW (ref >60.00)
Glucose, Bld: 113 mg/dL — ABNORMAL HIGH (ref 70–99)
Potassium: 4.3 meq/L (ref 3.5–5.1)
Sodium: 136 meq/L (ref 135–145)
Total Bilirubin: 1.5 mg/dL — ABNORMAL HIGH (ref 0.2–1.2)
Total Protein: 8 g/dL (ref 6.0–8.3)

## 2024-01-03 LAB — CBC
HCT: 49.4 % — ABNORMAL HIGH (ref 36.0–46.0)
Hemoglobin: 16.4 g/dL — ABNORMAL HIGH (ref 12.0–15.0)
MCHC: 33.1 g/dL (ref 30.0–36.0)
MCV: 87.7 fl (ref 78.0–100.0)
Platelets: 352 10*3/uL (ref 150.0–400.0)
RBC: 5.63 Mil/uL — ABNORMAL HIGH (ref 3.87–5.11)
RDW: 14.2 % (ref 11.5–15.5)
WBC: 7.5 10*3/uL (ref 4.0–10.5)

## 2024-01-03 LAB — HEMOGLOBIN A1C: Hgb A1c MFr Bld: 5.7 % (ref 4.6–6.5)

## 2024-01-03 MED ORDER — BENZONATATE 200 MG PO CAPS: 200.0000 mg | ORAL_CAPSULE | Freq: Two times a day (BID) | ORAL | 0 refills | Status: AC | PRN

## 2024-01-03 MED ORDER — FLUTICASONE PROPIONATE 50 MCG/ACT NA SUSP: 2.0000 | Freq: Every day | NASAL | 0 refills | Status: AC | PRN

## 2024-01-03 MED ORDER — MECLIZINE HCL 12.5 MG PO TABS: 12.5000 mg | ORAL_TABLET | Freq: Three times a day (TID) | ORAL | 0 refills | Status: AC | PRN

## 2024-01-03 NOTE — Assessment & Plan Note (Signed)
Checking lipid panel and adjust simvastatin 20 mg daily as needed.

## 2024-01-03 NOTE — Assessment & Plan Note (Signed)
 BP at goal on diltiazem  and hydrochlorothiazide . Checking CMP and adjust as needed.

## 2024-01-03 NOTE — Assessment & Plan Note (Signed)
 Checking HgA1c and adjust as needed.

## 2024-01-03 NOTE — Progress Notes (Signed)
 Subjective:   Patient ID: Olivia Mendoza, female    DOB: 06-Dec-1945, 78 y.o.   MRN: [id number]  HPI Here for medicare wellness, no new complaints. Please see A/P for status and treatment of chronic medical problems.   Diet: heart healthy Physical activity: sedentary Depression/mood screen: negative Hearing: intact to whispered voice Visual acuity: grossly normal with contacts, performs annual eye exam  ADLs: capable Fall risk: none Home safety: good Cognitive evaluation: intact to orientation, naming, recall and repetition EOL planning: adv directives discussed  Flowsheet Row Office Visit from 01/03/2024 in [facility]  PHQ-2 Total Score 0           10/16/2021    3:14 PM 05/29/2022    9:50 AM 11/02/2022    3:18 PM 11/07/2022    3:05 PM 01/03/2024   11:48 AM  Fall Risk  Falls in the past year? 0  0 0 0  Was there an injury with Fall? 0  0 0 0  Fall Risk Category Calculator 0  0 0 0  Fall Risk Category (Retired) Low      (RETIRED) Patient Fall Risk Level Low fall risk Low fall risk     Patient at Risk for Falls Due to No Fall Risks  No Fall Risks Medication side effect   Fall risk Follow up Falls evaluation completed  Falls evaluation completed Falls prevention discussed;Education provided;Falls evaluation completed Falls evaluation completed   I have personally reviewed and have noted 1. The patient's medical and social history - reviewed today no changes 2. Their use of alcohol, tobacco or illicit drugs 3. Their current medications and supplements 4. The patient's functional ability including ADL's, fall risks, home safety risks and hearing or visual impairment. 5. Diet and physical activities 6. Evidence for depression or mood disorders 7. Care team reviewed and updated 8.  The patient is not on an opioid pain medication  Patient Care Team: Adelia Homestead, MD as PCP - General (Internal Medicine) Mealor, Donnamae Gaba, MD as PCP -  Electrophysiology (Cardiology) Loyde Rule, MD as PCP - Cardiology (Cardiology) Mills Alma, MD (Inactive) (Obstetrics and Gynecology) Claudette Cue, MD (Inactive) (Gastroenterology) Alvina Axon, MD as Consulting Physician (Ophthalmology) Past Medical History:  Diagnosis Date   Dysphagia    Fibrocystic breast disease    GERD (gastroesophageal reflux disease)    History of cystopexy    History of migraines    Hyperlipemia    IBS (irritable bowel syndrome)    Mononucleosis    MVP (mitral valve prolapse)    Positional vertigo    PVC (premature ventricular contraction)    Routine general medical examination at a health care facility    Past Surgical History:  Procedure Laterality Date   BIOPSY THYROID      BREAST CYST ASPIRATION     CARDIOVERSION N/A 05/29/2022   Procedure: CARDIOVERSION;  Surgeon: Euell Herrlich, MD;  Location: [facility] ENDOSCOPY;  Service: Cardiovascular;  Laterality: N/A;   LAPAROSCOPIC TUBAL LIGATION     Bilateral   VAGINAL HYSTERECTOMY  89   Family History  Problem Relation Age of Onset   Hypertension Mother    Hyperlipidemia Mother    Stroke Mother    Hiatal hernia Mother    Arthritis Mother    Heart disease Mother        PTVDP-ss syndrome (Dr Johnney Nam)   Hemochromatosis Father    Hyperlipidemia Father    Hypertension Father    Heart disease  Father        CAD/CABG   Heart disease Brother        AAA Stent, PVD s/p stent, CAD, lipids   Breast cancer Maternal Grandmother 92   Review of Systems  Constitutional: Negative.   HENT: Negative.    Eyes: Negative.   Respiratory:  Negative for cough, chest tightness and shortness of breath.   Cardiovascular:  Negative for chest pain, palpitations and leg swelling.  Gastrointestinal:  Negative for abdominal distention, abdominal pain, constipation, diarrhea, nausea and vomiting.  Musculoskeletal: Negative.   Skin: Negative.   Neurological: Negative.   Psychiatric/Behavioral: Negative.       Objective:  Physical Exam Constitutional:      Appearance: She is well-developed.  HENT:     Head: Normocephalic and atraumatic.  Cardiovascular:     Rate and Rhythm: Normal rate. Rhythm irregular.  Pulmonary:     Effort: Pulmonary effort is normal. No respiratory distress.     Breath sounds: Normal breath sounds. No wheezing or rales.  Abdominal:     General: Bowel sounds are normal. There is no distension.     Palpations: Abdomen is soft.     Tenderness: There is no abdominal tenderness. There is no rebound.  Musculoskeletal:     Cervical back: Normal range of motion.  Skin:    General: Skin is warm and dry.  Neurological:     Mental Status: She is alert and oriented to person, place, and time.     Coordination: Coordination normal.     Vitals:   01/03/24 1139  BP: 138/60  Pulse: 96  Temp: 98.1 F (36.7 C)  TempSrc: Oral  SpO2: 96%  Weight: 186 lb (84.4 kg)  Height: 5' 6.5" (1.689 m)    Assessment & Plan:

## 2024-01-03 NOTE — Patient Instructions (Signed)
  Ms. Kingsford , Thank you for taking time to come for your Medicare Wellness Visit. I appreciate your ongoing commitment to your health goals. Please review the following plan we discussed and let me know if I can assist you in the future.   These are the goals we discussed:  Goals      Client understands the importance of follow-up with providers by attending scheduled visits     My goal is to get back to walking and being physically active.     Patient Stated     11/07/2022, start walking and lose 10 pounds and maintain it        This is a list of the screening recommended for you and due dates:  Health Maintenance  Topic Date Due   DTaP/Tdap/Td vaccine (2 - Td or Tdap) 12/19/2020   COVID-19 Vaccine (4 - 2024-25 season) 05/12/2023   Medicare Annual Wellness Visit  11/08/2023   Flu Shot  04/10/2024   Pneumonia Vaccine  Completed   DEXA scan (bone density measurement)  Completed   Hepatitis C Screening  Completed   Zoster (Shingles) Vaccine  Completed   HPV Vaccine  Aged Out   Meningitis B Vaccine  Aged Out   Colon Cancer Screening  Discontinued

## 2024-01-03 NOTE — Assessment & Plan Note (Signed)
 Checking CMP and CBC and lipid panel. She is on pradaxa  and diltiazem  and getting ablation later this year.

## 2024-01-03 NOTE — Assessment & Plan Note (Signed)
 Flu shot yearly. Pneumonia complete. Shingrix complete. Tetanus up to date. Colonoscopy up to date. Mammogram up to date, pap smear up to date and dexa up to date. Counseled about sun safety and mole surveillance. Counseled about the dangers of distracted driving. Given 10 year screening recommendations.

## 2024-01-03 NOTE — Assessment & Plan Note (Signed)
 Taking pepcid and adequate control.

## 2024-02-12 ENCOUNTER — Other Ambulatory Visit: Payer: Self-pay | Admitting: Obstetrics and Gynecology

## 2024-02-12 DIAGNOSIS — D242 Benign neoplasm of left breast: Secondary | ICD-10-CM

## 2024-02-12 DIAGNOSIS — Z6830 Body mass index (BMI) 30.0-30.9, adult: Secondary | ICD-10-CM | POA: Diagnosis not present

## 2024-02-12 DIAGNOSIS — Z01419 Encounter for gynecological examination (general) (routine) without abnormal findings: Secondary | ICD-10-CM | POA: Diagnosis not present

## 2024-02-26 ENCOUNTER — Other Ambulatory Visit: Payer: Self-pay | Admitting: Obstetrics and Gynecology

## 2024-02-26 ENCOUNTER — Encounter: Payer: Self-pay | Admitting: Obstetrics and Gynecology

## 2024-02-26 ENCOUNTER — Ambulatory Visit
Admission: RE | Admit: 2024-02-26 | Discharge: 2024-02-26 | Disposition: A | Discharge status: Home / Self Care | Source: Ambulatory Visit | Attending: Obstetrics and Gynecology | Admitting: Obstetrics and Gynecology

## 2024-02-26 DIAGNOSIS — N6001 Solitary cyst of right breast: Secondary | ICD-10-CM | POA: Diagnosis not present

## 2024-02-26 DIAGNOSIS — D242 Benign neoplasm of left breast: Secondary | ICD-10-CM

## 2024-02-26 DIAGNOSIS — N6002 Solitary cyst of left breast: Secondary | ICD-10-CM | POA: Diagnosis not present

## 2024-03-26 DIAGNOSIS — H2513 Age-related nuclear cataract, bilateral: Secondary | ICD-10-CM | POA: Diagnosis not present

## 2024-03-26 DIAGNOSIS — H5213 Myopia, bilateral: Secondary | ICD-10-CM | POA: Diagnosis not present

## 2024-03-26 DIAGNOSIS — H25013 Cortical age-related cataract, bilateral: Secondary | ICD-10-CM | POA: Diagnosis not present

## 2024-08-21 ENCOUNTER — Encounter: Payer: Self-pay | Admitting: Cardiovascular Disease

## 2024-09-23 ENCOUNTER — Other Ambulatory Visit: Payer: Self-pay | Admitting: Cardiovascular Disease

## 2024-09-23 NOTE — Progress Notes (Unsigned)
 " Cardiology Office Note    Patient Name: Olivia Mendoza Date of Encounter: 09/23/2024  Primary Care Provider:  Rollene Almarie LABOR, MD Primary Cardiologist:  Maude Emmer, MD Primary Electrophysiologist: Eulas FORBES Furbish, MD   Past Medical History    Past Medical History:  Diagnosis Date   Dysphagia    Fibrocystic breast disease    GERD (gastroesophageal reflux disease)    History of cystopexy    History of migraines    Hyperlipemia    IBS (irritable bowel syndrome)    Mononucleosis    MVP (mitral valve prolapse)    Positional vertigo    PVC (premature ventricular contraction)    Routine general medical examination at a health care facility     History of Present Illness  Olivia Mendoza is a 79 y.o. female with PMH of HLD, aortic stenosis, paroxysmal AF, PVCs, MVP, who presents today for follow-up She is new to me Previously seen by Dr Dann and EP Mealor   Ms. Scarpati was seen on 11/05/2022 by Dr. Dann  BP better with exercise. Taking norvasc , hydrochlorothiazide  and ? Cardizem  She is on statin. She changed from eliquis  to pradaxa  due to cost   Seen by PA 06/20/23 she has been experiencing increased shortness of breath and fatigue with exertion.  Her blood pressure was 138/68 and heart rate was 92 bpm.  She has been tolerating Pradaxa  without any adverse reactions.  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 Monitor 09/17/22 with continuous afib average rate 80 bpm and felt better off beta blockers. Again Dr Furbish recommended rate control and not rhythm control   TTE 05/01/22 EF 60-65% moderate AR and mild/moderate MR LA 55 mm read is mildly dilated But this is in severe range  She would like to see Dr Furbish again to discuss ablation. She wants to wait until her husband has repeat surgery with Deri at [facility] for AAA endoleak    Review of Systems  Please see the history of present illness.    All other systems reviewed and are otherwise negative except as noted above.  Physical Exam    Wt Readings from Last 3 Encounters:  01/03/24 84.4 kg  09/25/23 84.4 kg  06/20/23 85.4 kg   VS: There were no vitals filed for this visit.  ,There is no height or weight on file to calculate BMI. GEN: Well nourished, well developed in no acute distress Neck: No JVD; No carotid bruits Pulmonary: Clear to auscultation without rales, wheezing or rhonchi  Cardiovascular: Irregularly irregular. Normal S1. Normal S2.   Murmurs: There is no murmur.  ABDOMEN: Soft, non-tender, non-distended EXTREMITIES:  No edema; No deformity   EKG/LABS/ Recent Cardiac Studies   ECG personally reviewed by me today -none completed today  Risk Assessment/Calculations:    CHA2DS2-VASc Score =     This indicates a  % annual risk of stroke. The patient's score is based upon:      Lab Results  Component Value Date   WBC 7.5 01/03/2024   HGB 16.4 (H) 01/03/2024   HCT 49.4 (H) 01/03/2024   MCV 87.7 01/03/2024   PLT 352.0 01/03/2024   Lab Results  Component Value Date   CREATININE 0.93 01/03/2024   BUN 17 01/03/2024   NA 136 01/03/2024   K 4.3 01/03/2024   CL 97 01/03/2024  CO2 30 01/03/2024   Lab Results  Component Value Date   CHOL 206 (H) 01/03/2024   HDL 59.90 01/03/2024   LDLCALC 86 01/03/2024   LDLDIRECT 134.0 11/02/2022   TRIG 299.0 (H) 01/03/2024   CHOLHDL 3 01/03/2024    Lab Results  Component Value Date   HGBA1C 5.7 01/03/2024   Assessment & Plan    1.Paroxysmal atrial fibrillation: -Prior [facility] 05/2022 but ERAF - Seen by EP multiple times Dr Nancey rate control no rhythm control. Monitor 09/19/22 good rate control Does not tolerate beta blocker  - Continue Pradaxa  for anticoagulation - Norvasc  d/c and now on cardizem   2.  Essential hypertension: -well controlled  -Continue HCTZ 25 mg daily and Cardizem  240mg   daily  3.Mitral/Aortic  regurgitation: -Moderate AR and mild/mod MR on TTE  05/01/22 - No CHF and normal LV size/function on TTE - update TTE   4.Hyperlipidemia: -Patient's last LDL was 86 -Patient is statin intolerant , tolerating Zocor  20 mg daily -She is working on lifestyle modifications in an effort to control her cholesterol.  TTE for AR/MR   Disposition: Follow-up with DR Mealor 3 months and general cardiology in a year     Signed, Maude Emmer, MD  09/23/2024, 5:11 PM [facility] "

## 2024-09-30 NOTE — Progress Notes (Signed)
 " Cardiology Office Note    Patient Name: Olivia Mendoza Date of Encounter: 10/06/2024  Primary Care Provider:  Rollene Almarie LABOR, MD Primary Cardiologist:  Maude Emmer, MD Primary Electrophysiologist: Eulas FORBES Furbish, MD   Past Medical History    Past Medical History:  Diagnosis Date   Dysphagia    Fibrocystic breast disease    GERD (gastroesophageal reflux disease)    History of cystopexy    History of migraines    Hyperlipemia    IBS (irritable bowel syndrome)    Mononucleosis    MVP (mitral valve prolapse)    Positional vertigo    PVC (premature ventricular contraction)    Routine general medical examination at a health care facility     History of Present Illness  Olivia Mendoza is a 79 y.o. female with PMH of HLD, aortic stenosis, paroxysmal AF, PVCs, MVP, who presents today for follow-up    Olivia Mendoza was seen on 11/05/2022 by Dr. Dann  BP better with exercise. Taking norvasc , hydrochlorothiazide  and ? Cardizem  She is on statin. She changed from eliquis  to pradaxa  due to cost   Seen by PA 06/20/23 she has been experiencing increased shortness of breath and fatigue with exertion.  Her blood pressure was 138/68 and heart rate was 92 bpm.  She has been tolerating Pradaxa  without any adverse reactions.  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 Monitor 09/17/22 with continuous afib average rate 80 bpm and felt better off beta blockers. Again Dr Furbish recommended rate control and not rhythm control   TTE 05/01/22 EF 60-65% moderate AR and mild/moderate MR LA 55 mm   She would like to see Dr Furbish again to discuss ablation.    I think this is the etiology of her dyspnea and not AR. She wants to have ablation now   Review of Systems  Please see the history of present illness.    All other systems reviewed and are otherwise  negative except as noted above.  Physical Exam    Wt Readings from Last 3 Encounters:  10/06/24 192 lb (87.1 kg)  01/03/24 186 lb (84.4 kg)  09/25/23 186 lb (84.4 kg)   VS: Vitals:   10/06/24 0938  BP: (!) 150/72  Pulse: 92  SpO2: 97%    ,Body mass index is 30.53 kg/m. GEN: Well nourished, well developed in no acute distress Neck: No JVD; No carotid bruits Pulmonary: Clear to auscultation without rales, wheezing or rhonchi  Cardiovascular: Irregularly irregular. Normal S1. Normal S2.   Murmurs: There is no murmur.  ABDOMEN: Soft, non-tender, non-distended EXTREMITIES:  No edema; No deformity   EKG/LABS/ Recent Cardiac Studies   ECG:  afib rate 86 nonspecific ST changes 2023  afib rate 92 RAD     Lab Results  Component Value Date   WBC 7.5 01/03/2024   HGB 16.4 (H) 01/03/2024   HCT 49.4 (H) 01/03/2024   MCV 87.7 01/03/2024   PLT 352.0 01/03/2024   Lab Results  Component Value Date   CREATININE 0.93 01/03/2024   BUN 17 01/03/2024   NA 136 01/03/2024   K 4.3 01/03/2024   CL 97 01/03/2024   CO2 30 01/03/2024   Lab Results  Component Value Date   CHOL 206 (H) 01/03/2024   HDL 59.90 01/03/2024   LDLCALC 86 01/03/2024   LDLDIRECT 134.0  11/02/2022   TRIG 299.0 (H) 01/03/2024   CHOLHDL 3 01/03/2024    Lab Results  Component Value Date   HGBA1C 5.7 01/03/2024   Assessment & Plan    1.Paroxysmal atrial fibrillation: -Prior [facility] 05/2022 but ERAF - Seen by EP multiple times Dr Nancey rate control no rhythm control. Monitor 09/19/22 good rate control Does not tolerate beta blocker  - Continue Pradaxa  for anticoagulation - Norvasc  d/c and now on cardizem   2.  Essential hypertension: -well controlled  -Continue HCTZ 25 mg daily and Cardizem  240mg  daily  3.Mitral/Aortic  regurgitation: -Moderate AR and mild/mod MR on TTE  05/01/22 - No CHF and normal LV size/function on TTE - update TTE   4.Hyperlipidemia: -Patient's last LDL was 86 -Patient is statin  intolerant , tolerating Zocor  20 mg daily -She is working on lifestyle modifications in an effort to control her cholesterol.  TTE in 6 months post ablation   Disposition: Follow-up with DR [facility] April is first available  F/U with me in 6 months    Signed, Maude Emmer, MD  10/06/2024, 10:27 AM [facility] "

## 2024-10-06 ENCOUNTER — Ambulatory Visit: Admitting: Cardiovascular Disease

## 2024-10-06 VITALS — BP 150/72 | HR 92 | Ht 66.5 in | Wt 192.0 lb

## 2024-10-06 DIAGNOSIS — E785 Hyperlipidemia, unspecified: Secondary | ICD-10-CM | POA: Diagnosis not present

## 2024-10-06 DIAGNOSIS — I341 Nonrheumatic mitral (valve) prolapse: Secondary | ICD-10-CM | POA: Diagnosis not present

## 2024-10-06 DIAGNOSIS — I10 Essential (primary) hypertension: Secondary | ICD-10-CM | POA: Insufficient documentation

## 2024-10-06 DIAGNOSIS — I4891 Unspecified atrial fibrillation: Secondary | ICD-10-CM | POA: Insufficient documentation

## 2024-10-06 MED ORDER — SIMVASTATIN 20 MG PO TABS: 20.0000 mg | ORAL_TABLET | Freq: Every day | ORAL | 3 refills | Status: AC

## 2024-10-06 MED ORDER — DILTIAZEM HCL ER COATED BEADS 240 MG PO CP24: 240.0000 mg | ORAL_CAPSULE | Freq: Every day | ORAL | 3 refills | Status: AC

## 2024-10-06 MED ORDER — DABIGATRAN ETEXILATE MESYLATE 150 MG PO CAPS: 150.0000 mg | ORAL_CAPSULE | Freq: Two times a day (BID) | ORAL | 3 refills | Status: AC

## 2024-10-06 MED ORDER — HYDROCHLOROTHIAZIDE 25 MG PO TABS: 25.0000 mg | ORAL_TABLET | Freq: Every day | ORAL | 3 refills | Status: AC

## 2024-10-06 NOTE — Patient Instructions (Signed)
 Medication Instructions:  CONTINUE current medications Cardiac medications refilled today   *If you need a refill on your cardiac medications before your next appointment, please call your pharmacy*   Follow-Up: At [facility], you and your health needs are our priority.  As part of our continuing mission to provide you with exceptional heart care, our providers are all part of one team.  This team includes your primary Cardiologist (physician) and Advanced Practice Providers or APPs (Physician Assistants and Nurse Practitioners) who all work together to provide you with the care you need, when you need it.  Your next appointment:    6 months with Dr. Delford -- CALL in March/April to schedule for July/August   Keep scheduled appointment with Dr. Nancey   We recommend signing up for the patient portal called MyChart.  Sign up information is provided on this After Visit Summary.  MyChart is used to connect with patients for Virtual Visits (Telemedicine).  Patients are able to view lab/test results, encounter notes, upcoming appointments, etc.  Non-urgent messages can be sent to your provider as well.   To learn more about what you can do with MyChart, go to [email or web address].   Other Instructions

## 2024-10-11 ENCOUNTER — Other Ambulatory Visit: Payer: Self-pay | Admitting: Cardiovascular Disease

## 2024-12-15 ENCOUNTER — Ambulatory Visit: Admitting: Cardiovascular Disease

## 2024-12-23 ENCOUNTER — Ambulatory Visit: Admitting: Cardiovascular Disease

## 2025-01-05 ENCOUNTER — Ambulatory Visit: Admitting: Internal Medicine
